# Patient Record
Sex: Female | Born: 1965 | Race: Black or African American | Hispanic: No | Marital: Married | Smoking: Never smoker
Health system: Southern US, Community
[De-identification: ages and names within clinical notes are randomized; demographics above are authoritative.]

## PROBLEM LIST (undated history)

## (undated) DIAGNOSIS — Z8742 Personal history of other diseases of the female genital tract: Secondary | ICD-10-CM

## (undated) DIAGNOSIS — Z800 Family history of malignant neoplasm of digestive organs: Secondary | ICD-10-CM

## (undated) DIAGNOSIS — Z9289 Personal history of other medical treatment: Secondary | ICD-10-CM

## (undated) DIAGNOSIS — E785 Hyperlipidemia, unspecified: Secondary | ICD-10-CM

## (undated) DIAGNOSIS — F419 Anxiety disorder, unspecified: Secondary | ICD-10-CM

## (undated) DIAGNOSIS — K219 Gastro-esophageal reflux disease without esophagitis: Secondary | ICD-10-CM

## (undated) DIAGNOSIS — Z8041 Family history of malignant neoplasm of ovary: Secondary | ICD-10-CM

## (undated) HISTORY — DX: Family history of malignant neoplasm of digestive organs: Z80.0

## (undated) HISTORY — DX: Personal history of other medical treatment: Z92.89

## (undated) HISTORY — DX: Gastro-esophageal reflux disease without esophagitis: K21.9

## (undated) HISTORY — PX: TUBAL LIGATION: SHX77

## (undated) HISTORY — DX: Anxiety disorder, unspecified: F41.9

## (undated) HISTORY — DX: Hyperlipidemia, unspecified: E78.5

## (undated) HISTORY — PX: DILATION AND CURETTAGE OF UTERUS: SHX78

## (undated) HISTORY — DX: Family history of malignant neoplasm of ovary: Z80.41

## (undated) HISTORY — DX: Personal history of other diseases of the female genital tract: Z87.42

---

## 2003-11-29 HISTORY — PX: LEEP: SHX91

## 2008-08-28 ENCOUNTER — Ambulatory Visit: Payer: Self-pay

## 2008-11-28 HISTORY — PX: COLONOSCOPY: SHX174

## 2009-09-01 ENCOUNTER — Ambulatory Visit: Payer: Self-pay

## 2009-11-24 ENCOUNTER — Ambulatory Visit: Payer: Self-pay | Admitting: Gastroenterology

## 2010-09-02 ENCOUNTER — Ambulatory Visit: Payer: Self-pay

## 2010-09-03 ENCOUNTER — Ambulatory Visit: Payer: Self-pay

## 2011-09-09 ENCOUNTER — Ambulatory Visit: Payer: Self-pay

## 2012-09-11 ENCOUNTER — Ambulatory Visit: Payer: Self-pay

## 2013-09-18 ENCOUNTER — Ambulatory Visit: Payer: Self-pay

## 2015-07-07 ENCOUNTER — Ambulatory Visit: Payer: Self-pay | Admitting: Family Medicine

## 2015-08-06 ENCOUNTER — Ambulatory Visit: Payer: Self-pay | Admitting: Family Medicine

## 2015-10-20 ENCOUNTER — Encounter: Payer: Self-pay | Admitting: Family Medicine

## 2015-11-23 ENCOUNTER — Other Ambulatory Visit: Payer: Self-pay | Admitting: Family Medicine

## 2015-12-08 ENCOUNTER — Ambulatory Visit: Payer: Self-pay | Admitting: Family Medicine

## 2015-12-14 ENCOUNTER — Encounter: Payer: Self-pay | Admitting: Family Medicine

## 2015-12-14 ENCOUNTER — Ambulatory Visit (INDEPENDENT_AMBULATORY_CARE_PROVIDER_SITE_OTHER): Payer: Commercial Managed Care - PPO | Admitting: Family Medicine

## 2015-12-14 VITALS — BP 140/88 | HR 91 | Temp 98.8°F | Resp 16 | Ht 65.0 in | Wt 182.2 lb

## 2015-12-14 DIAGNOSIS — J3089 Other allergic rhinitis: Secondary | ICD-10-CM

## 2015-12-14 DIAGNOSIS — J0100 Acute maxillary sinusitis, unspecified: Secondary | ICD-10-CM

## 2015-12-14 DIAGNOSIS — E785 Hyperlipidemia, unspecified: Secondary | ICD-10-CM

## 2015-12-14 DIAGNOSIS — K219 Gastro-esophageal reflux disease without esophagitis: Secondary | ICD-10-CM | POA: Diagnosis not present

## 2015-12-14 MED ORDER — LOVASTATIN 40 MG PO TABS: 40.0000 mg | ORAL_TABLET | Freq: Every day | ORAL | Status: DC

## 2015-12-14 MED ORDER — AMOXICILLIN-POT CLAVULANATE 875-125 MG PO TABS
1.0000 | ORAL_TABLET | Freq: Two times a day (BID) | ORAL | Status: DC
Start: 2015-12-14 — End: 2017-09-01

## 2015-12-14 MED ORDER — PREDNISONE 20 MG PO TABS: 20.0000 mg | ORAL_TABLET | Freq: Two times a day (BID) | ORAL | Status: DC

## 2015-12-14 MED ORDER — DEXTROMETHORPHAN-GUAIFENESIN 5-100 MG/5ML PO LIQD: 2.0000 [oz_av] | Freq: Two times a day (BID) | ORAL | Status: DC

## 2015-12-14 NOTE — Progress Notes (Signed)
Name: Cassidy Collins   MRN: [id number]    DOB: [birth year]   Date:12/14/2015       Progress Note  Subjective  Chief Complaint  Chief Complaint  Patient presents with  . Hyperlipidemia    6 month follow up  . Sinusitis    HPI  Hyperlipidemia  Patient has a history of hyperlipidemia for over 5 years.  Current medical regimen consist of lovastatin 40 mg daily at bedtime .  Compliance is good .  Diet and exercise are currently followed fairly well .  Risk factors for cardiovascular disease include hyperlipidemia relatively sedentary to lifestyle .   There have been no side effects from the medication.    GERD  Several year history gastroesophageal reflux symptoms. They are mild to moderate in severity complete consist of heartburn and burping gas. They respond well to omeprazole 40 mg daily. This been no melena or hematochezia and no hematemesis at any time.  Allergic rhinitis  Patient is currently using fluticasone as needed for her allergic rhinitis. She's had it for more than 5 years. Symptoms are mild to moderate and are worse during URIs or certain seasons of the year.  Sinusitis  Patient presents with greater than 7 day history of nasal congestion and drainage which is purulent in color. There is tenderness over the sinuses. There has been fever to - along with some associated chills on occasion. Usage of over-the-counter medications is not been affected. There is also accompanying cough productive of purulent sputum.  Past Medical History  Diagnosis Date  . Hyperlipidemia   . GERD (gastroesophageal reflux disease)     Social History  Substance Use Topics  . Smoking status: Never Smoker   . Smokeless tobacco: Not on file  . Alcohol Use: No     Current outpatient prescriptions:  .  lovastatin (MEVACOR) 40 MG tablet, , Disp: , Rfl: 0 .  omeprazole (PRILOSEC) 40 MG capsule, take 1 capsule by mouth once daily, Disp: 90 capsule, Rfl: 1  Allergies  Allergen  Reactions  . Bee Venom     Review of Systems  Constitutional: Negative for fever, chills and weight loss.  HENT: Positive for congestion. Negative for hearing loss, sore throat and tinnitus.   Eyes: Negative for blurred vision, double vision and redness.  Respiratory: Positive for cough. Negative for hemoptysis and shortness of breath.   Cardiovascular: Negative for chest pain, palpitations, orthopnea, claudication and leg swelling.  Gastrointestinal: Positive for heartburn. Negative for nausea, vomiting, diarrhea, constipation and blood in stool.  Genitourinary: Negative for dysuria, urgency, frequency and hematuria.  Musculoskeletal: Negative for myalgias, back pain, joint pain, falls and neck pain.  Skin: Negative for itching.  Neurological: Negative for dizziness, tingling, tremors, focal weakness, seizures, loss of consciousness, weakness and headaches.  Endo/Heme/Allergies: Does not bruise/bleed easily.  Psychiatric/Behavioral: Negative for depression and substance abuse. The patient is not nervous/anxious and does not have insomnia.      Objective  Filed Vitals:   12/14/15 1014  BP: 140/88  Pulse: 91  Temp: 98.8 F (37.1 C)  TempSrc: Oral  Resp: 16  Height: 5\' 5"  (1.651 m)  Weight: 182 lb 3.2 oz (82.645 kg)  SpO2: 94%     Physical Exam  Constitutional: She is oriented to person, place, and time and well-developed, well-nourished, and in no distress.  HENT:  Head: Normocephalic.  Tender over the frontal and most maxillary sinuses with swollen nasal turbinates with mostly clear discharge.  Eyes: EOM are normal.  Pupils are equal, round, and reactive to light.  Neck: Normal range of motion. No thyromegaly present.  Cardiovascular: Normal rate, regular rhythm and normal heart sounds.   No murmur heard. Pulmonary/Chest: Effort normal and breath sounds normal.  Abdominal: Soft. Bowel sounds are normal.  Musculoskeletal: Normal range of motion. She exhibits no edema.   Neurological: She is alert and oriented to person, place, and time. No cranial nerve deficit. Gait normal.  Skin: Skin is warm and dry. No rash noted.  Psychiatric: Memory and affect normal.      Assessment & Plan  1. Hyperlipidemia  - Comprehensive Metabolic Panel (CMET) - Lipid panel - Comprehensive Metabolic Panel (CMET) - Lipid panel - TSH - lovastatin (MEVACOR) 40 MG tablet; Take 1 tablet (40 mg total) by mouth at bedtime.  Dispense: 90 tablet; Refill: 1  2. Gastroesophageal reflux disease, esophagitis presence not specified Stable   3. Acute maxillary sinusitis, recurrence not specified As below - amoxicillin-clavulanate (AUGMENTIN) 875-125 MG tablet; Take 1 tablet by mouth 2 (two) times daily.  Dispense: 20 tablet; Refill: 0 - predniSONE (DELTASONE) 20 MG tablet; Take 1 tablet (20 mg total) by mouth 2 (two) times daily with a meal.  Dispense: 10 tablet; Refill: 0 - Dextromethorphan-Guaifenesin (DELSYM CGH/CHEST CONG DM CHILD) 5-100 MG/5ML LIQD; Take 2 oz by mouth 2 (two) times daily.  Dispense: 1 Bottle; Refill: 0  4. Other allergic rhinitis Flonase  - Dextromethorphan-Guaifenesin (DELSYM CGH/CHEST CONG DM CHILD) 5-100 MG/5ML LIQD; Take 2 oz by mouth 2 (two) times daily.  Dispense: 1 Bottle; Refill: 0

## 2016-06-20 ENCOUNTER — Ambulatory Visit: Payer: Commercial Managed Care - PPO | Admitting: Family Medicine

## 2016-09-22 ENCOUNTER — Other Ambulatory Visit: Payer: Self-pay | Admitting: Certified Nurse Midwife

## 2016-09-22 DIAGNOSIS — Z1231 Encounter for screening mammogram for malignant neoplasm of breast: Secondary | ICD-10-CM

## 2016-10-04 ENCOUNTER — Ambulatory Visit
Admission: RE | Admit: 2016-10-04 | Discharge: 2016-10-04 | Disposition: A | Discharge status: Home / Self Care | Payer: Commercial Managed Care - PPO | Source: Ambulatory Visit | Attending: Certified Nurse Midwife | Admitting: Certified Nurse Midwife

## 2016-10-04 DIAGNOSIS — Z9289 Personal history of other medical treatment: Secondary | ICD-10-CM

## 2016-10-04 DIAGNOSIS — Z1231 Encounter for screening mammogram for malignant neoplasm of breast: Secondary | ICD-10-CM | POA: Insufficient documentation

## 2016-10-04 HISTORY — DX: Personal history of other medical treatment: Z92.89

## 2017-09-01 ENCOUNTER — Ambulatory Visit (INDEPENDENT_AMBULATORY_CARE_PROVIDER_SITE_OTHER): Payer: Commercial Managed Care - PPO | Admitting: Family Medicine

## 2017-09-01 ENCOUNTER — Encounter: Payer: Self-pay | Admitting: Family Medicine

## 2017-09-01 VITALS — BP 124/86 | HR 82 | Temp 98.1°F | Resp 16 | Ht 65.0 in | Wt 176.8 lb

## 2017-09-01 DIAGNOSIS — E663 Overweight: Secondary | ICD-10-CM | POA: Diagnosis not present

## 2017-09-01 DIAGNOSIS — E785 Hyperlipidemia, unspecified: Secondary | ICD-10-CM | POA: Insufficient documentation

## 2017-09-01 DIAGNOSIS — Z0189 Encounter for other specified special examinations: Secondary | ICD-10-CM | POA: Diagnosis not present

## 2017-09-01 DIAGNOSIS — K219 Gastro-esophageal reflux disease without esophagitis: Secondary | ICD-10-CM

## 2017-09-01 DIAGNOSIS — Z23 Encounter for immunization: Secondary | ICD-10-CM | POA: Diagnosis not present

## 2017-09-01 DIAGNOSIS — J3089 Other allergic rhinitis: Secondary | ICD-10-CM | POA: Diagnosis not present

## 2017-09-01 DIAGNOSIS — J302 Other seasonal allergic rhinitis: Secondary | ICD-10-CM

## 2017-09-01 DIAGNOSIS — Z008 Encounter for other general examination: Secondary | ICD-10-CM

## 2017-09-01 MED ORDER — AZELASTINE-FLUTICASONE 137-50 MCG/ACT NA SUSP: 2.0000 | Freq: Every day | NASAL | 3 refills | Status: DC

## 2017-09-01 MED ORDER — LOVASTATIN 40 MG PO TABS: 40.0000 mg | ORAL_TABLET | Freq: Every day | ORAL | 1 refills | Status: DC

## 2017-09-01 MED ORDER — RANITIDINE HCL 150 MG PO TABS: 150.0000 mg | ORAL_TABLET | Freq: Every day | ORAL | 1 refills | Status: DC

## 2017-09-01 MED ORDER — FEXOFENADINE HCL 180 MG PO TABS: 180.0000 mg | ORAL_TABLET | Freq: Every day | ORAL | 1 refills | Status: DC

## 2017-09-01 NOTE — Progress Notes (Signed)
Name: Cassidy Collins   MRN: [id number]    DOB: 01/12/1966   Date:09/01/2017       Progress Note  Subjective  Chief Complaint  Chief Complaint  Patient presents with  . Hyperlipidemia    medication refill  . Annual Exam  . Sinusitis    HPI  Biometric Screening: Patient needs biometric screening for work/insurance.  She needs lipid panel, and fasting BG as well as waist circumference and BP recorded.  Also needs flu shot.  Does well woman exams with [facility] - Has appt to have this done on 09/25/2017.   Overweight: Trying to eat healthier; has a treadmill at home and plans to use this more often; last week she walked every day, but depending on the stress level of her job that week she may or may not exercise.  Denies polyphagia, polydipsia, or polyuria.  Hyperlipidemia: We will check labs today.  Taking Lovastatin and doing well on medication. Denies muscle aches, chest pain, or shortness of breath  Allergic Rhinitis: Has been using OTC flonase daily and claritin daily, would like to switch to allegra.  Symptoms are always worse in the spring and fall. She is concerned for sinusitis today because she has had sinus congestion since 08/19/2017.  Also having sinus pain and sinus headache, along with bilateral ear pressure. Coughs occasionally at night; no sneezing. No fevers/chills, vision changes.  GERD: PT was taking omeprazole daily, but has been out for over a year - she has been buying OTC. We discussed risks of long-term use of PPI's and we will try Zantac instead. Denies blood in stools/dark and tarry stools, vomiting/nausea.  She loves tomatoes and does drink a cocktail on the weekends which triggers her GERD - stress is also a trigger.  There are no active problems to display for this patient.   Past Surgical History:  Procedure Laterality Date  . COLON SURGERY      Family History  Problem Relation Age of Onset  . Colon cancer Father   . Breast cancer Maternal  Aunt 11    Social History   Social History  . Marital status: Married    Spouse name: N/A  . Number of children: N/A  . Years of education: N/A   Occupational History  . Not on file.   Social History Main Topics  . Smoking status: Never Smoker  . Smokeless tobacco: Never Used  . Alcohol use No  . Drug use: No  . Sexual activity: Yes    Partners: Male   Other Topics Concern  . Not on file   Social History Narrative  . No narrative on file     Current Outpatient Prescriptions:  .  lovastatin (MEVACOR) 40 MG tablet, Take 1 tablet (40 mg total) by mouth at bedtime., Disp: 90 tablet, Rfl: 1 .  omeprazole (PRILOSEC) 40 MG capsule, take 1 capsule by mouth once daily, Disp: 90 capsule, Rfl: 1 .  amoxicillin-clavulanate (AUGMENTIN) 875-125 MG tablet, Take 1 tablet by mouth 2 (two) times daily. (Patient not taking: Reported on 09/01/2017), Disp: 20 tablet, Rfl: 0 .  Dextromethorphan-Guaifenesin (DELSYM CGH/CHEST CONG DM CHILD) 5-100 MG/5ML LIQD, Take 2 oz by mouth 2 (two) times daily. (Patient not taking: Reported on 09/01/2017), Disp: 1 Bottle, Rfl: 0 .  predniSONE (DELTASONE) 20 MG tablet, Take 1 tablet (20 mg total) by mouth 2 (two) times daily with a meal. (Patient not taking: Reported on 09/01/2017), Disp: 10 tablet, Rfl: 0  Allergies  Allergen  Reactions  . Bee Venom      ROS  Constitutional: Negative for fever or weight change.  HEENT: See HPI Respiratory: Negative for cough and shortness of breath.   Cardiovascular: Negative for chest pain or palpitations.  Gastrointestinal: Negative for abdominal pain, no bowel changes.  Musculoskeletal: Negative for gait problem or joint swelling.  Skin: Negative for rash.  Neurological: Negative for dizziness or headache.  No other specific complaints in a complete review of systems (except as listed in HPI above).  Objective  Vitals:   09/01/17 1009  BP: 124/86  Pulse: 82  Resp: 16  Temp: 98.1 F (36.7 C)  TempSrc: Oral   SpO2: 97%  Weight: 176 lb 12.8 oz (80.2 kg)  Height:  (1.651 m)   Body mass index is 29.42 kg/m.  Physical Exam Constitutional: Patient appears well-developed and well-nourished. Obese. No distress.  HEENT: head atraumatic, normocephalic, pupils equal and reactive to light, ears, neck supple, throat within normal limits, sinuses are non-tender; turbinates are hypertrophied bilaterally. Cardiovascular: Normal rate, regular rhythm and normal heart sounds.  No murmur heard. No BLE edema. Pulmonary/Chest: Effort normal and breath sounds normal. No respiratory distress. Abdominal: Soft.  There is no tenderness. Psychiatric: Patient has a normal mood and affect. behavior is normal. Judgment and thought content normal.  No results found for this or any previous visit (from the past 2160 hour(s)).  PHQ2/9: Depression screen [facility] 2/9 09/01/2017 12/14/2015  Decreased Interest 0 0  Down, Depressed, Hopeless 0 0  PHQ - 2 Score 0 0   Fall Risk: Fall Risk  09/01/2017 12/14/2015  Falls in the past year? No No   Assessment & Plan  1. Encounter for biometric screening - Comprehensive metabolic panel - Lipid panel - Goal is to exercise more often and regularly - 3-4 days of walking per week.  2. Seasonal allergic rhinitis, unspecified trigger - fexofenadine (ALLEGRA) 180 MG tablet; Take 1 tablet (180 mg total) by mouth daily.  Dispense: 90 tablet; Refill: 1 - Azelastine-Fluticasone 137-50 MCG/ACT SUSP; Place 2 sprays into the nose daily.  Dispense: 23 g; Refill: 3  3. Hyperlipidemia, unspecified hyperlipidemia type - Lipid panel  4. Overweight (BMI 25.0-29.9) - Comprehensive metabolic panel - Lipid panel  5. Needs flu shot - Flu Vaccine QUAD 6+ mos PF IM (Fluarix Quad PF)  7. Gastroesophageal reflux disease without esophagitis - ranitidine (ZANTAC) 150 MG tablet; Take 1 tablet (150 mg total) by mouth at bedtime.  Dispense: 90 tablet; Refill: 1

## 2017-09-01 NOTE — Patient Instructions (Addendum)
Food Choices for Gastroesophageal Reflux Disease, Adult When you have gastroesophageal reflux disease (GERD), the foods you eat and your eating habits are very important. Choosing the right foods can help ease your discomfort. What guidelines do I need to follow?  Choose fruits, vegetables, whole grains, and low-fat dairy products.  Choose low-fat meat, fish, and poultry.  Limit fats such as oils, salad dressings, butter, nuts, and avocado.  Keep a food diary. This helps you identify foods that cause symptoms.  Avoid foods that cause symptoms. These may be different for everyone.  Eat small meals often instead of 3 large meals a day.  Eat your meals slowly, in a place where you are relaxed.  Limit fried foods.  Cook foods using methods other than frying.  Avoid drinking alcohol.  Avoid drinking large amounts of liquids with your meals.  Avoid bending over or lying down until 2-3 hours after eating. What foods are not recommended? These are some foods and drinks that may make your symptoms worse: Vegetables  Tomatoes. Tomato juice. Tomato and spaghetti sauce. Chili peppers. Onion and garlic. Horseradish. Fruits  Oranges, grapefruit, and lemon (fruit and juice). Meats  High-fat meats, fish, and poultry. This includes hot dogs, ribs, ham, sausage, salami, and bacon. Dairy  Whole milk and chocolate milk. Sour cream. Cream. Butter. Ice cream. Cream cheese. Drinks  Coffee and tea. Bubbly (carbonated) drinks or energy drinks. Condiments  Hot sauce. Barbecue sauce. Sweets/Desserts  Chocolate and cocoa. Donuts. Peppermint and spearmint. Fats and Oils  High-fat foods. This includes French fries and potato chips. Other  Vinegar. Strong spices. This includes black pepper, white pepper, red pepper, cayenne, curry powder, cloves, ginger, and chili powder. The items listed above may not be a complete list of foods and drinks to avoid. Contact your dietitian for more information.    This information is not intended to replace advice given to you by your health care provider. Make sure you discuss any questions you have with your health care provider. Document Released: 05/15/2012 Document Revised: 04/21/2016 Document Reviewed: 09/18/2013 Elsevier Interactive Patient Education  2017 Elsevier Inc.  

## 2017-09-02 LAB — LIPID PANEL
CHOL/HDL RATIO: 3.2 ratio (ref 0.0–4.4)
Cholesterol, Total: 247 mg/dL — ABNORMAL HIGH (ref 100–199)
HDL: 78 mg/dL (ref >39)
LDL Calculated: 151 mg/dL — ABNORMAL HIGH (ref 0–99)
TRIGLYCERIDES: 88 mg/dL (ref 0–149)
VLDL Cholesterol Cal: 18 mg/dL (ref 5–40)

## 2017-09-02 LAB — COMPREHENSIVE METABOLIC PANEL
A/G RATIO: 1.7 (ref 1.2–2.2)
ALBUMIN: 4.6 g/dL (ref 3.5–5.5)
ALK PHOS: 113 IU/L (ref 39–117)
ALT: 19 IU/L (ref 0–32)
AST: 18 IU/L (ref 0–40)
BILIRUBIN TOTAL: 0.3 mg/dL (ref 0.0–1.2)
BUN / CREAT RATIO: 10 (ref 9–23)
BUN: 10 mg/dL (ref 6–24)
CO2: 24 mmol/L (ref 20–29)
CREATININE: 0.96 mg/dL (ref 0.57–1.00)
Calcium: 9.6 mg/dL (ref 8.7–10.2)
Chloride: 104 mmol/L (ref 96–106)
GFR calc Af Amer: 80 mL/min/{1.73_m2} (ref >59)
GFR calc non Af Amer: 69 mL/min/{1.73_m2} (ref >59)
GLOBULIN, TOTAL: 2.7 g/dL (ref 1.5–4.5)
Glucose: 89 mg/dL (ref 65–99)
Potassium: 4.7 mmol/L (ref 3.5–5.2)
Sodium: 143 mmol/L (ref 134–144)
Total Protein: 7.3 g/dL (ref 6.0–8.5)

## 2017-09-25 ENCOUNTER — Encounter: Payer: Self-pay | Admitting: Certified Nurse Midwife

## 2017-09-25 ENCOUNTER — Ambulatory Visit (INDEPENDENT_AMBULATORY_CARE_PROVIDER_SITE_OTHER): Payer: Commercial Managed Care - PPO | Admitting: Certified Nurse Midwife

## 2017-09-25 VITALS — BP 132/90 | HR 88 | Ht 65.0 in | Wt 176.0 lb

## 2017-09-25 DIAGNOSIS — Z124 Encounter for screening for malignant neoplasm of cervix: Secondary | ICD-10-CM

## 2017-09-25 DIAGNOSIS — Z8041 Family history of malignant neoplasm of ovary: Secondary | ICD-10-CM

## 2017-09-25 DIAGNOSIS — Z01419 Encounter for gynecological examination (general) (routine) without abnormal findings: Secondary | ICD-10-CM

## 2017-09-25 DIAGNOSIS — Z1231 Encounter for screening mammogram for malignant neoplasm of breast: Secondary | ICD-10-CM

## 2017-09-25 DIAGNOSIS — Z1211 Encounter for screening for malignant neoplasm of colon: Secondary | ICD-10-CM | POA: Diagnosis not present

## 2017-09-25 DIAGNOSIS — Z1239 Encounter for other screening for malignant neoplasm of breast: Secondary | ICD-10-CM

## 2017-09-25 DIAGNOSIS — Z800 Family history of malignant neoplasm of digestive organs: Secondary | ICD-10-CM | POA: Diagnosis not present

## 2017-09-25 NOTE — Progress Notes (Addendum)
Gynecology Annual Exam  PCP: Hubbard Hartshorn, FNP  Chief Complaint:  Chief Complaint  Patient presents with  . Gynecologic Exam    History of Present Illness:Cassidy Collins is a 51 year old African American/Black female , G 1 P 1 0 0 1 , who presents for her annual exam . She is having no significant GYN problems.  Her menses are absent and she is postmenopausal since 2009 (age 37).  She has had no spotting.   The patient's past medical history is notable for a history of hyperlipdemia, GERD, allergic rhinitis.  Since her last annual GYN exam dated 09/22/2016 , she has had no significant changes in her health history.  Had her flu shot this year.  She is sexually active.  Her most recent pap smear was obtained 09/22/2016 and was NIL/neg HRHPV. 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 Her most recent mammogram obtained on 10/04/2016 was normal.  There is a positive history of breast cancer in her maternal aunt. Genetic testing has not been done.  There is a family history of ovarian cancer in her maternal grandmother. Genetic testing has been done. Her sister tested negative for BRCA 1 and negative for BRCA 2.  The patient does do monthly self breast exams.  Last colonoscopy :2010 (father has colon cancer). Results: normal. Next due now. The patient does not smoke.  The patient does drink occasionally.  The patient does not use illegal drugs.  The patient usually exercises regularly on the elliptical and treadmill.  The patient does get adequate calcium in her diet  And with her vitamins She had a recent cholesterol screen in 2018 by PCP that was abnormal.    The patient denies current symptoms of depression.    Review of Systems: Review of Systems  Constitutional: Negative for chills, fever and weight loss.  HENT: Negative for congestion, sinus pain and sore throat.   Eyes: Negative for blurred vision and pain.  Respiratory: Negative for  hemoptysis, shortness of breath and wheezing.   Cardiovascular: Negative for chest pain, palpitations and leg swelling.  Gastrointestinal: Negative for abdominal pain, blood in stool, diarrhea, heartburn, nausea and vomiting.  Genitourinary: Negative for dysuria, frequency, hematuria and urgency.  Musculoskeletal: Negative for back pain, joint pain and myalgias.  Skin: Negative for itching and rash.  Neurological: Negative for dizziness, tingling and headaches.  Endo/Heme/Allergies: Negative for environmental allergies and polydipsia. Does not bruise/bleed easily.       Negative for hirsutism   Psychiatric/Behavioral: Negative for depression. The patient is not nervous/anxious and does not have insomnia.     Past Medical History:  Past Medical History:  Diagnosis Date  . Family history of colon cancer   . Family history of ovarian cancer   . GERD (gastroesophageal reflux disease)   . History of abnormal cervical Pap smear    positive HRHPV  . Hyperlipidemia     Past Surgical History:  Past Surgical History:  Procedure Laterality Date  . COLONOSCOPY  2010   Dr Adair Laundry  . DILATION AND CURETTAGE OF UTERUS    . 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 TUBAL LIGATION      Family History:  Family History  Problem Relation Age of Onset  . Colon cancer Father 49  . Hypertension Father   . Breast cancer Maternal Aunt 69  . Ovarian cancer Maternal Grandmother 75  . Hypertension Mother  Social History:  Social History   Social History  . Marital status: Married    Spouse name: N/A  . Number of children: 1  . Years of education: N/A   Occupational History  . Claims Coder Specialist    Social History Main Topics  . Smoking status: Never Smoker  . Smokeless tobacco: Never Used  . Alcohol use 0.0 oz/week     Comment: occasionally on weekend  . Drug use: No  . Sexual activity: Yes    Partners: Male    Birth control/ protection: Post-menopausal   Other  Topics Concern  . Not on file   Social History Narrative  . No narrative on file    Allergies:  Allergies  Allergen Reactions  . Bee Venom     Medications: Prior to Admission medications   Medication Sig Start Date End Date Taking? Authorizing Provider  Azelastine-Fluticasone 137-50 MCG/ACT SUSP Place 2 sprays into the nose daily. 09/01/17   Hubbard Hartshorn, FNP  fexofenadine (ALLEGRA) 180 MG tablet Take 1 tablet (180 mg total) by mouth daily. 09/01/17   Hubbard Hartshorn, FNP  lovastatin (MEVACOR) 40 MG tablet Take 1 tablet (40 mg total) by mouth at bedtime. 09/01/17   Hubbard Hartshorn, FNP  ranitidine (ZANTAC) 150 MG tablet Take 1 tablet (150 mg total) by mouth at bedtime. 09/01/17   Hubbard Hartshorn, FNP  And [employer] multivitamins  Physical Exam Vitals: BP 132/90   Pulse 88   Ht 5' 5"  (1.651 m)   Wt 176 lb (79.8 kg)   BMI 29.29 kg/m   General: BF in NAD, appears younger than stated age 7: normocephalic, anicteric Neck: no thyroid enlargement, no palpable nodules, no cervical lymphadenopathy  Pulmonary: No increased work of breathing, CTAB Cardiovascular: RRR, without murmur  Breast: Breast symmetrical, no tenderness, no palpable nodules or masses, no skin or nipple retraction present, no nipple discharge.  No axillary, infraclavicular or supraclavicular lymphadenopathy. Abdomen: Soft, non-tender, non-distended.  Umbilicus without lesions.  No hepatomegaly or masses palpable. No evidence of hernia. Genitourinary:  External: Normal external female genitalia.  Normal urethral meatus, normal Bartholin's and Skene's glands.    Vagina: Normal vaginal mucosa, no evidence of prolapse.    Cervix: Grossly normal in appearance, no bleeding, non-tender  Uterus: Anteflexed, normal size, shape, and consistency, mobile, and non-tender  Adnexa: No adnexal masses, non-tender  Rectal: deferred  Lymphatic: no evidence of inguinal lymphadenopathy Extremities: no edema, erythema, or  tenderness Neurologic: Grossly intact Psychiatric: mood appropriate, affect full     Assessment: 51 y.o. annual gyn exam Family history of ovarian, breast and colon cancer  Plan:   1) Breast cancer screening - recommend monthly self breast exam and annual screening mammogram. Mammogram was ordered today. Recommended MYRISK testing. Given booklet on MYRISK testing. She wants to check coverage with her insurance and will let me know if she desires 2) Colon cancer screening: will refer to [facility] for colonoscopy  3) Cervical cancer screening - Pap was done.   4) Osteoporosis prevention: Discussed calcium and vitamin D3 requirements. Encouraged weight bearing exercise.  5) Routine healthcare maintenance including cholesterol and diabetes screening managed by PCP   6) RTO in 1 year.  Dalia Heading, CNM

## 2017-09-25 NOTE — Patient Instructions (Signed)
Preventing Osteoporosis, Adult Osteoporosis is a condition that causes the bones to get weaker. With osteoporosis, the bones become thinner, and the normal spaces in bone tissue become larger. This can make the bones weak and cause them to break more easily. People who have osteoporosis are more likely to break their wrist, spine, or hip. Even a minor accident or injury can be enough to break weak bones. Osteoporosis can occur with aging. Your body constantly replaces old bone tissue with new tissue. As you get older, you may lose bone tissue more quickly, or it may be replaced more slowly. Osteoporosis is more likely to develop if you have poor nutrition or do not get enough calcium or vitamin D. Other lifestyle factors can also play a role. By making some diet and lifestyle changes, you can help to keep your bones healthy and help to prevent osteoporosis. What nutrition changes can be made? Nutrition plays an important role in maintaining healthy, strong bones.  Make sure you get enough calcium every day from food or from calcium supplements. ? If you are age 50 or younger, aim to get 1,000 mg of calcium every day. ? If you are older than age 50, aim to get 1,200 mg of calcium every day.  Try to get enough vitamin D every day. ? If you are age 70 or younger, aim to get 600 international units (IU) every day. ? If you are older than age 70, aim to get 800 international units every day.  Follow a healthy diet. Eat plenty of foods that contain calcium and vitamin D. ? Calcium is in milk, cheese, yogurt, and other dairy products. Some fish and vegetables are also good sources of calcium. Many foods such as cereals and breads have had calcium added to them (are fortified). Check nutrition labels to see how much calcium is in a food or drink. ? Foods that contain vitamin D include milk, cereals, salmon, and tuna. Your body also makes vitamin D when you are out in the sun. Bare skin exposure to the sun on  your face, arms, legs, or back for no more than 30 minutes a day, 2 times per week is more than enough. Beyond that, it is important to use sunblock to protect your skin from sunburn, which increases your risk for skin cancer.  What lifestyle changes can be made? Making changes in your everyday life can also play an important role in preventing osteoporosis.  Stay active and get exercise every day. Ask your health care provider what types of exercise are best for you.  Do not use any products that contain nicotine or tobacco, such as cigarettes and e-cigarettes. If you need help quitting, ask your health care provider.  Limit alcohol intake to no more than 1 drink a day for nonpregnant women and 2 drinks a day for men. One drink equals 12 oz of beer, 5 oz of wine, or 1 oz of hard liquor.  Why are these changes important? Making these nutrition and lifestyle changes can:  Help you develop and maintain healthy, strong bones.  Prevent loss of bone mass and the problems that are caused by that loss, such as broken bones and delayed healing.  Make you feel better mentally and physically.  What can happen if changes are not made? Problems that can result from osteoporosis can be very serious. These may include:  A higher risk of broken bones that are painful and do not heal well.  Physical malformations, such as   a collapsed spine or a hunched back.  Problems with movement.  Where to find support: If you need help making changes to prevent osteoporosis, talk with your health care provider. You can ask for a referral to a diet and nutrition specialist (dietitian) and a physical therapist. Where to find more information: Learn more about osteoporosis from:  NIH Osteoporosis and Related Bone Diseases National Resource Center: www.niams.nih.gov/health_info/bone/osteoporosis/osteoporosis_ff.asp  U.S. Office on Women's Health:  www.womenshealth.gov/publications/our-publications/fact-sheet/osteoporosis.html  National Osteoporosis Foundation: www.nof.org/patients/what-is-osteoporosis/  Summary  Osteoporosis is a condition that causes weak bones that are more likely to break.  Eating a healthy diet and making sure you get enough calcium and vitamin D can help prevent osteoporosis.  Other ways to reduce your risk of osteoporosis include getting regular exercise and avoiding alcohol and products that contain nicotine or tobacco. This information is not intended to replace advice given to you by your health care provider. Make sure you discuss any questions you have with your health care provider. Document Released: 11/29/2015 Document Revised: 07/25/2016 Document Reviewed: 07/25/2016 Elsevier Interactive Patient Education  2018 Elsevier Inc.  

## 2017-09-26 ENCOUNTER — Encounter: Payer: Self-pay | Admitting: Certified Nurse Midwife

## 2017-09-27 LAB — IGP,RFX APTIMA HPV ALL PTH: PAP SMEAR COMMENT: 0

## 2017-10-16 ENCOUNTER — Ambulatory Visit
Admission: RE | Admit: 2017-10-16 | Discharge: 2017-10-16 | Disposition: A | Discharge status: Home / Self Care | Payer: Commercial Managed Care - PPO | Source: Ambulatory Visit | Attending: Certified Nurse Midwife | Admitting: Certified Nurse Midwife

## 2017-10-16 DIAGNOSIS — Z1231 Encounter for screening mammogram for malignant neoplasm of breast: Secondary | ICD-10-CM | POA: Diagnosis present

## 2017-10-16 DIAGNOSIS — Z1239 Encounter for other screening for malignant neoplasm of breast: Secondary | ICD-10-CM

## 2017-11-28 DIAGNOSIS — F419 Anxiety disorder, unspecified: Secondary | ICD-10-CM

## 2017-11-28 HISTORY — DX: Anxiety disorder, unspecified: F41.9

## 2018-01-20 ENCOUNTER — Other Ambulatory Visit: Payer: Self-pay

## 2018-01-20 ENCOUNTER — Encounter: Payer: Self-pay | Admitting: Gynecology

## 2018-01-20 ENCOUNTER — Ambulatory Visit
Admission: EM | Admit: 2018-01-20 | Discharge: 2018-01-20 | Disposition: A | Discharge status: Home / Self Care | Payer: Commercial Managed Care - PPO | Attending: Orthopedic Surgery | Admitting: Orthopedic Surgery

## 2018-01-20 DIAGNOSIS — J320 Chronic maxillary sinusitis: Secondary | ICD-10-CM

## 2018-01-20 DIAGNOSIS — J019 Acute sinusitis, unspecified: Secondary | ICD-10-CM

## 2018-01-20 DIAGNOSIS — J0100 Acute maxillary sinusitis, unspecified: Secondary | ICD-10-CM

## 2018-01-20 MED ORDER — AMOXICILLIN-POT CLAVULANATE 875-125 MG PO TABS: 1.0000 | ORAL_TABLET | Freq: Two times a day (BID) | ORAL | 0 refills | Status: AC

## 2018-01-20 NOTE — ED Provider Notes (Signed)
[facility]    CSN: [id number] Arrival date & time: 01/20/18  1007     History   Chief Complaint Chief Complaint  Patient presents with  . Sinusitis    HPI Cassidy Collins is a 52 y.o. female.   Presents urgent care facility for evaluation of sinus pain, pressure, congestion, runny nose.  Symptoms have been present for 6 days.  She is been taking over-the-counter medications consisting of DayQuil, NyQuil, Tylenol and ibuprofen with mild improvement a few days ago but her last 2 days her symptoms have increased.  She feels that she has had a lot of sinus pain and pressure.  Denies any fevers.  No body aches, nausea vomiting or diarrhea.  Cough is nonproductive.  No chest pain or shortness of breath.  HPI  Past Medical History:  Diagnosis Date  . Family history of colon cancer   . Family history of ovarian cancer   . GERD (gastroesophageal reflux disease)   . History of abnormal cervical Pap smear    positive HRHPV  . Hyperlipidemia     Patient Active Problem List   Diagnosis Date Noted  . Family history of colon cancer   . Family history of ovarian cancer   . Hyperlipidemia 09/01/2017  . Overweight (BMI 25.0-29.9) 09/01/2017  . Other allergic rhinitis 09/01/2017  . Gastroesophageal reflux disease without esophagitis 09/01/2017    Past Surgical History:  Procedure Laterality Date  . COLONOSCOPY  2010   Dr Vanetta Shawl  . DILATION AND CURETTAGE OF UTERUS    . 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 TUBAL LIGATION      OB History    Gravida Para Term Preterm AB Living   1 1 1     1    SAB TAB Ectopic Multiple Live Births           1       Home Medications    Prior to Admission medications   Medication Sig Start Date End Date Taking? Authorizing Provider  Azelastine-Fluticasone 137-50 MCG/ACT SUSP Place 2 sprays into the nose daily. 09/01/17  Yes Doren Custard, FNP  fexofenadine (ALLEGRA) 180 MG tablet Take 1 tablet (180 mg total) by  mouth daily. 09/01/17  Yes Doren Custard, FNP  lovastatin (MEVACOR) 40 MG tablet Take 1 tablet (40 mg total) by mouth at bedtime. 09/01/17  Yes Doren Custard, FNP  ranitidine (ZANTAC) 150 MG tablet Take 1 tablet (150 mg total) by mouth at bedtime. 09/01/17  Yes Doren Custard, FNP  amoxicillin-clavulanate (AUGMENTIN) 875-125 MG tablet Take 1 tablet by mouth every 12 (twelve) hours for 7 days. 7 days 01/20/18 01/27/18  Evon Slack, PA-C    Family History Family History  Problem Relation Age of Onset  . Colon cancer Father 87  . Hypertension Father   . Breast cancer Maternal Aunt 42  . Ovarian cancer Maternal Grandmother 75  . Hypertension Mother     Social History Social History   Tobacco Use  . Smoking status: Never Smoker  . Smokeless tobacco: Never Used  Substance Use Topics  . Alcohol use: Yes    Alcohol/week: 0.0 oz    Comment: occasionally on weekend  . Drug use: No     Allergies   Bee venom   Review of Systems Review of Systems  Constitutional: Negative for fever.  HENT: Positive for congestion, rhinorrhea, sinus pressure, sinus pain and sore throat. Negative for ear discharge, trouble  swallowing and voice change.   Respiratory: Positive for cough. Negative for shortness of breath, wheezing and stridor.   Cardiovascular: Negative for chest pain.  Gastrointestinal: Negative for abdominal pain, diarrhea, nausea and vomiting.  Genitourinary: Negative for dysuria, flank pain and pelvic pain.  Musculoskeletal: Positive for myalgias. Negative for back pain.  Skin: Negative for rash.  Neurological: Negative for dizziness and headaches.     Physical Exam Triage Vital Signs ED Triage Vitals  Enc Vitals Group     BP 01/20/18 1029 (!) 145/93     Pulse Rate 01/20/18 1029 91     Resp 01/20/18 1029 16     Temp 01/20/18 1029 98.5 F (36.9 C)     Temp Source 01/20/18 1029 Oral     SpO2 01/20/18 1029 99 %     Weight 01/20/18 1025 181 lb (82.1 kg)     Height 01/20/18  1025 5\' 5"  (1.651 m)     Head Circumference --      Peak Flow --      Pain Score 01/20/18 1025 6     Pain Loc --      Pain Edu? --      Excl. in GC? --    No data found.  Updated Vital Signs BP (!) 145/93 (BP Location: Left Arm)   Pulse 91   Temp 98.5 F (36.9 C) (Oral)   Resp 16   Ht 5\' 5"  (1.651 m)   Wt 181 lb (82.1 kg)   SpO2 99%   BMI 30.12 kg/m   Visual Acuity Right Eye Distance:   Left Eye Distance:   Bilateral Distance:    Right Eye Near:   Left Eye Near:    Bilateral Near:     Physical Exam  Constitutional: She is oriented to person, place, and time. She appears well-developed and well-nourished. No distress.  HENT:  Head: Normocephalic and atraumatic.  Right Ear: Hearing, tympanic membrane, external ear and ear canal normal.  Left Ear: Hearing, tympanic membrane, external ear and ear canal normal.  Nose: Rhinorrhea present.  Mouth/Throat: Mucous membranes are normal. No trismus in the jaw. No uvula swelling. Posterior oropharyngeal erythema present. No oropharyngeal exudate, posterior oropharyngeal edema or tonsillar abscesses. No tonsillar exudate.  Positive frontal maxillary sinus tenderness to palpation.  No pharyngeal erythema exudates.  Uvula is midline.  Eyes: Conjunctivae are normal. Right eye exhibits no discharge. Left eye exhibits no discharge.  Neck: Normal range of motion.  Cardiovascular: Normal rate and regular rhythm.  Pulmonary/Chest: Effort normal and breath sounds normal. No stridor. No respiratory distress. She has no wheezes. She has no rales.  Abdominal: Soft. She exhibits no distension. There is no tenderness.  Musculoskeletal: Normal range of motion. She exhibits no deformity.  Lymphadenopathy:    She has cervical adenopathy.  Neurological: She is alert and oriented to person, place, and time. She has normal reflexes.  Skin: Skin is warm and dry.  Psychiatric: She has a normal mood and affect. Her behavior is normal. Thought content  normal.     UC Treatments / Results  Labs (all labs ordered are listed, but only abnormal results are displayed) Labs Reviewed - No data to display  EKG  EKG Interpretation None       Radiology No results found.  Procedures Procedures (including critical care time)  Medications Ordered in UC Medications - No data to display   Initial Impression / Assessment and Plan / UC Course  I have reviewed the triage  vital signs and the nursing notes.  Pertinent labs & imaging results that were available during my care of the patient were reviewed by me and considered in my medical decision making (see chart for details).     52 year old female with acute sinusitis.  Symptoms been present for nearly 1 week with increasing symptoms over the last couple days.  She is started on Augmentin.  She will continue with over-the-counter symptomatic treatment.  She states given signs and symptoms to return to the clinic for.  Final Clinical Impressions(s) / UC Diagnoses   Final diagnoses:  Chronic maxillary sinusitis    ED Discharge Orders        Ordered    amoxicillin-clavulanate (AUGMENTIN) 875-125 MG tablet  Every 12 hours     01/20/18 1056        Evon Slack, [place] 01/20/18 1117

## 2018-01-20 NOTE — ED Triage Notes (Signed)
Per patient c/o sinus problem  X 7 days.

## 2018-01-20 NOTE — Discharge Instructions (Signed)
Take medications as prescribed.  Continue with over-the-counter medications.  Would recommend Flonase.

## 2018-01-24 ENCOUNTER — Telehealth: Payer: Self-pay | Admitting: Family Medicine

## 2018-01-24 NOTE — Telephone Encounter (Signed)
Patient must be seen here. Cannot call in medication unless our office has seen them

## 2018-01-24 NOTE — Telephone Encounter (Signed)
Called pt and she states she will call back to schedule when she can

## 2018-01-24 NOTE — Telephone Encounter (Signed)
Copied from CRM [id number]. Topic: Quick Communication - See Telephone Encounter >> Jan 24, 2018  9:06 AM Guinevere FerrariMorris, Shaira Sova E, NT wrote: CRM for notification. See Telephone encounter for: Patient called and said she was just seen at urgent care for a sinus infection. Patient said she didn't receive anything for her cough and wanted to see if the doctor could call her something else in. [facility] [address] [place] [phone] (Phone) [phone] (Fax)    01/24/18.

## 2018-03-02 ENCOUNTER — Ambulatory Visit: Payer: Commercial Managed Care - PPO | Admitting: Family Medicine

## 2018-03-02 ENCOUNTER — Encounter: Payer: Self-pay | Admitting: Certified Nurse Midwife

## 2018-03-02 HISTORY — PX: COLONOSCOPY W/ BIOPSIES: SHX1374

## 2018-03-09 ENCOUNTER — Ambulatory Visit: Payer: Self-pay | Admitting: Nurse Practitioner

## 2018-03-23 ENCOUNTER — Encounter: Payer: Self-pay | Admitting: Certified Nurse Midwife

## 2018-06-25 ENCOUNTER — Encounter: Payer: Self-pay | Admitting: Family Medicine

## 2018-06-25 ENCOUNTER — Ambulatory Visit (INDEPENDENT_AMBULATORY_CARE_PROVIDER_SITE_OTHER): Payer: Commercial Managed Care - PPO | Admitting: Family Medicine

## 2018-06-25 VITALS — BP 138/96 | HR 89 | Temp 98.7°F | Resp 16 | Ht 65.0 in | Wt 173.0 lb

## 2018-06-25 DIAGNOSIS — Z23 Encounter for immunization: Secondary | ICD-10-CM

## 2018-06-25 DIAGNOSIS — R000 Tachycardia, unspecified: Secondary | ICD-10-CM | POA: Diagnosis not present

## 2018-06-25 DIAGNOSIS — F321 Major depressive disorder, single episode, moderate: Secondary | ICD-10-CM

## 2018-06-25 DIAGNOSIS — K219 Gastro-esophageal reflux disease without esophagitis: Secondary | ICD-10-CM | POA: Diagnosis not present

## 2018-06-25 DIAGNOSIS — E785 Hyperlipidemia, unspecified: Secondary | ICD-10-CM

## 2018-06-25 DIAGNOSIS — F419 Anxiety disorder, unspecified: Secondary | ICD-10-CM

## 2018-06-25 DIAGNOSIS — E663 Overweight: Secondary | ICD-10-CM

## 2018-06-25 DIAGNOSIS — I10 Essential (primary) hypertension: Secondary | ICD-10-CM

## 2018-06-25 DIAGNOSIS — Z114 Encounter for screening for human immunodeficiency virus [HIV]: Secondary | ICD-10-CM

## 2018-06-25 MED ORDER — OMEPRAZOLE 40 MG PO CPDR: 40.0000 mg | DELAYED_RELEASE_CAPSULE | Freq: Every day | ORAL | 1 refills | Status: DC

## 2018-06-25 MED ORDER — ESCITALOPRAM OXALATE 10 MG PO TABS: 10.0000 mg | ORAL_TABLET | Freq: Every day | ORAL | 0 refills | Status: DC

## 2018-06-25 MED ORDER — LISINOPRIL 10 MG PO TABS: 10.0000 mg | ORAL_TABLET | Freq: Every day | ORAL | 0 refills | Status: DC

## 2018-06-25 MED ORDER — HYDROXYZINE HCL 10 MG PO TABS: 10.0000 mg | ORAL_TABLET | Freq: Three times a day (TID) | ORAL | 0 refills | Status: DC | PRN

## 2018-06-25 NOTE — Patient Instructions (Addendum)
Psychologytoday.com - go to therapist finder to find a counselor.  12 Ways to Curb Anxiety  ?Anxiety is normal human sensation. It is what helped our ancestors survive the pitfalls of the wilderness. Anxiety is defined as experiencing worry or nervousness about an imminent event or something with an uncertain outcome. It is a feeling experienced by most people at some point in their lives. Anxiety can be triggered by a very personal issue, such as the illness of a loved one, or an event of global proportions, such as a refugee crisis. Some of the symptoms of anxiety are:  Feeling restless.  Having a feeling of impending danger.  Increased heart rate.  Rapid breathing. Sweating.  Shaking.  Weakness or feeling tired.  Difficulty concentrating on anything except the current worry.  Insomnia.  Stomach or bowel problems. What can we do about anxiety we may be feeling? There are many techniques to help manage stress and relax. Here are 12 ways you can reduce your anxiety almost immediately: 1. Turn off the constant feed of information. Take a social media sabbatical. Studies have shown that social media directly contributes to social anxiety.  2. Monitor your television viewing habits. Are you watching shows that are also contributing to your anxiety, such as 24-hour news stations? Try watching something else, or better yet, nothing at all. Instead, listen to music, read an inspirational book or practice a hobby. 3. Eat nutritious meals. Also, don't skip meals and keep healthful snacks on hand. Hunger and poor diet contributes to feeling anxious. 4. Sleep. Sleeping on a regular schedule for at least seven to eight hours a night will do wonders for your outlook when you are awake. 5. Exercise. Regular exercise will help rid your body of that anxious energy and help you get more restful sleep. 6. Try deep (diaphragmatic) breathing. Inhale slowly through your nose for five seconds and exhale through your  mouth. 7. Practice acceptance and gratitude. When anxiety hits, accept that there are things out of your control that shouldn't be of immediate concern.  8. Seek out humor. When anxiety strikes, watch a funny video, read jokes or call a friend who makes you laugh. Laughter is healing for our bodies and releases endorphins that are calming. 9. Stay positive. Take the effort to replace negative thoughts with positive ones. Try to see a stressful situation in a positive light. Try to come up with solutions rather than dwelling on the problem. 10. Figure out what triggers your anxiety. Keep a journal and make note of anxious moments and the events surrounding them. This will help you identify triggers you can avoid or even eliminate. 11. Talk to someone. Let a trusted friend, family member or even trained professional know that you are feeling overwhelmed and anxious. Verbalize what you are feeling and why.  12. Volunteer. If your anxiety is triggered by a crisis on a large scale, become an advocate and work to resolve the problem that is causing you unease. Anxiety is often unwelcome and can become overwhelming. If not kept in check, it can become a disorder that could require medical treatment. However, if you take the time to care for yourself and avoid the triggers that make you anxious, you will be able to find moments of relaxation and clarity that make your life much more enjoyable.  To help with reflux: - Elevate your head of bed - either with a few extra pillows, a wedge pillow, or by placing two bed risers under the  head of bed posts. - Avoid the following foods: citrus, fatty foods, chocolate, peppermint, and excessive alcohol, along with sodas, orange juice (acidic drinks) - Stop eating at least 3 hours before going to bed, minimize naps/laying down after eating. - No smoking. - If you are overweight or obese, exercising and losing weight will also help your symptoms. - Caution: prolonged use  of proton pump inhibitors like omeprazole (Prilosec), pantoprazole (Protonix), esomeprazole (Nexium), and others like Dexilant and Aciphex may increase your risk of pneumonia, Clostridium difficile colitis, osteoporosis, anemia and other health complications

## 2018-06-25 NOTE — Progress Notes (Signed)
Name: Cassidy Collins   MRN: [id number]    DOB: 02/08/1966   Date:06/26/2018       Progress Note  Subjective  Chief Complaint  Chief Complaint  Patient presents with  . Gastroesophageal Reflux    zantac do not work  . Anxiety    nervous energy due to work, stress  . Hypertension    HPI  GERD: - Current medication regimen: Taking zantac - tried to do this instead of omeprazole but it has not been working; would like to go back to omeprazole -discussed risk of long term PPI use and pt verbalizes understanding. - Possible Triggers: no or minimal alcohol (she stopped drinking alcohol 2 weeks ago; was drinking on the weekends only), no or mild caffeine use, using NSAID's regularly (excedrine) alcohol, caffeine, lying down after eating and tomato sauce - Endorses: chest discomfort - like something was "stuck" in her esophagus. 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  Got a speeding ticket recenty, car battery died and she had some extra expenses because of this.  Her son is in college and this is also stressful for her. Current Medication Regimen: Not on any medications Current Symptoms Include: depressed mood, insomnia, psychomotor agitation, difficulty concentrating, anxiety, disturbed sleep, Pt Denies the following symptoms: insomnia, hypersomnia, impaired memory, recurrent thoughts of death, suicidal thoughts with specific plan, panic attacks, Seeing Psychiatry? No Attending Counseling? No  HTN:  -does not take medications - has not been treated previously.  She has been under increased stress lately and thinks this is why her levels are elevated.  - no TIAs, no  chest pain on exertion, no dyspnea on exertion, no swelling of ankles, no orthostatic dizziness or lightheadedness, no palpitations - DASH diet discussed - pt does not follow a low sodium diet - The following  are contributing factors: stress, sedentary lifestyle, saturated fats in diet. - PT has been checking BP at home 150-106, 140/93; and was seen in th [facility] on 01/20/2018 and had BP of 145/93.  Overweight: Body mass index is 28.79 kg/m. Diet: frequent dining out, excessive fat intake, moderate sugar carbonated beverage intake and choosing foods inappropriate for diet consistency Exercise: not active Co-Morbid Conditions: dyslipidemias, GERD and hypertension; 2 or more of these conditions combined with BMI >30 is considered morbid obesity; is this diagnosis appropriate and/or added to patient's problem list? No - she is only overweight category, not obesity category   Patient Active Problem List   Diagnosis Date Noted  . Family history of colon cancer   . Family history of ovarian cancer   . Hyperlipidemia 09/01/2017  . Overweight (BMI 25.0-29.9) 09/01/2017  . Other allergic rhinitis 09/01/2017  . Gastroesophageal reflux disease without esophagitis 09/01/2017    Past Surgical History:  Procedure Laterality Date  . COLONOSCOPY  2010   Dr Vanetta Shawlh-WNL  . COLONOSCOPY W/ BIOPSIES  03/02/2018   hyperplastic polyp-repeat in 5 years/ Dr Norma Fredricksonoledo  . DILATION AND CURETTAGE OF UTERUS    . 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 TUBAL LIGATION      Family History  Problem Relation Age of Onset  . Colon cancer Father 1855  . Hypertension Father   .  Breast cancer Maternal Aunt 54  . Ovarian cancer Maternal Grandmother 75  . Hypertension Mother     Social History   Socioeconomic History  . Marital status: Married    Spouse name: Not on file  . Number of children: 1  . Years of education: Not on file  . Highest education level: Not on file  Occupational History  . Occupation:  Neurosurgeon  Social Needs  . Financial resource strain: Not on file  . Food insecurity:    Worry: Not on file    Inability: Not on file  . Transportation needs:    Medical: Not on file    Non-medical: Not on file  Tobacco Use  . Smoking status: Never Smoker  . Smokeless tobacco: Never Used  Substance and Sexual Activity  . Alcohol use: Yes    Alcohol/week: 0.0 oz    Comment: occasionally on weekend  . Drug use: No  . Sexual activity: Yes    Partners: Male    Birth control/protection: Post-menopausal  Lifestyle  . Physical activity:    Days per week: Not on file    Minutes per session: Not on file  . Stress: Not on file  Relationships  . Social connections:    Talks on phone: Not on file    Gets together: Not on file    Attends religious service: Not on file    Active member of club or organization: Not on file    Attends meetings of clubs or organizations: Not on file    Relationship status: Not on file  . Intimate partner violence:    Fear of current or ex partner: Not on file    Emotionally abused: Not on file    Physically abused: Not on file    Forced sexual activity: Not on file  Other Topics Concern  . Not on file  Social History Narrative  . Not on file     Current Outpatient Medications:  .  Azelastine-Fluticasone 137-50 MCG/ACT SUSP, Place 2 sprays into the nose daily., Disp: 23 g, Rfl: 3 .  fexofenadine (ALLEGRA) 180 MG tablet, Take 1 tablet (180 mg total) by mouth daily., Disp: 90 tablet, Rfl: 1 .  lovastatin (MEVACOR) 40 MG tablet, Take 1 tablet (40 mg total) by mouth at bedtime., Disp: 90 tablet, Rfl: 1 .  escitalopram (LEXAPRO) 10 MG tablet, Take 1 tablet (10 mg total) by mouth daily. Take 1/2 tablet for 7 days, then increase to 1 tablet, Disp: 30 tablet, Rfl: 0 .  hydrOXYzine (ATARAX/VISTARIL) 10 MG tablet, Take 1 tablet (10 mg total) by mouth every 8 (eight) hours as needed for anxiety. 20 tablets to last 60 days, Disp: 20 tablet, Rfl:  0 .  lisinopril (PRINIVIL,ZESTRIL) 10 MG tablet, Take 1 tablet (10 mg total) by mouth daily., Disp: 30 tablet, Rfl: 0 .  omeprazole (PRILOSEC) 40 MG capsule, Take 1 capsule (40 mg total) by mouth daily., Disp: 90 capsule, Rfl: 1  Allergies  Allergen Reactions  . Bee Venom     ROS Ten systems reviewed and is negative except as mentioned in HPI.  Objective  Vitals:   06/25/18 1512 06/25/18 1555  BP: (!) 160/90 (!) 138/96  Pulse: (!) 112 89  Resp: 16   Temp: 98.7 F (37.1 C)   TempSrc: Oral   SpO2: 98%   Weight: 173 lb (78.5 kg)   Height: 5\' 5"  (1.651 m)    Body mass index is 28.79 kg/m.  Physical Exam Constitutional:  Patient appears well-developed and well-nourished. No distress.  HENT: Head: Normocephalic and atraumatic. Ears: B TMs ok, no erythema or effusion; Nose: Nose normal. Mouth/Throat: Oropharynx is clear and moist. No oropharyngeal exudate.  Eyes: Conjunctivae and EOM are normal. Pupils are equal, round, and reactive to light. No scleral icterus.  Neck: Normal range of motion. Neck supple. No JVD present. No thyromegaly present.  Cardiovascular: Normal rate, regular rhythm and normal heart sounds.  No murmur heard. No BLE edema. Pulmonary/Chest: Effort normal and breath sounds normal. No respiratory distress Neurological: she is alert and oriented to person, place, and time. No cranial nerve deficit. Coordination, balance, strength, speech and gait are normal.  Skin: Skin is warm and dry. No rash noted. No erythema. Patch of coarse hair noted under chin. Psychiatric: Patient has an anxious mood and affect. behavior is appropriate. Judgment and thought content normal.  No results found for this or any previous visit (from the past 72 hour(s)).   PHQ2/9: Depression screen [facility] 2/9 06/25/2018 06/25/2018 09/01/2017 12/14/2015  Decreased Interest 2 0 0 0  Down, Depressed, Hopeless 1 0 0 0  PHQ - 2 Score 3 0 0 0  Altered sleeping 1 - - -  Tired, decreased energy 1 - - -   Change in appetite 1 - - -  Feeling bad or failure about yourself  2 - - -  Trouble concentrating 3 - - -  Moving slowly or fidgety/restless 3 - - -  Suicidal thoughts 0 - - -  PHQ-9 Score 14 - - -  Difficult doing work/chores Somewhat difficult - - -   Fall Risk: Fall Risk  06/25/2018 09/01/2017 12/14/2015  Falls in the past year? No No No   Assessment & Plan   1. Current moderate episode of major depressive disorder without prior episode (HCC) - escitalopram (LEXAPRO) 10 MG tablet; Take 1 tablet (10 mg total) by mouth daily. Take 1/2 tablet for 7 days, then increase to 1 tablet  Dispense: 30 tablet; Refill: 0 - hydrOXYzine (ATARAX/VISTARIL) 10 MG tablet; Take 1 tablet (10 mg total) by mouth every 8 (eight) hours as needed for anxiety. 20 tablets to last 60 days  Dispense: 20 tablet; Refill: 0 -See AVS regarding discussion on tips for anxiety  2. Gastroesophageal reflux disease without esophagitis - omeprazole (PRILOSEC) 40 MG capsule; Take 1 capsule (40 mg total) by mouth daily.  Dispense: 90 capsule; Refill: 1 - discussed triggers, elevated HOB - see AVS  4. Essential hypertension - TSH - COMPLETE METABOLIC PANEL WITH GFR - CBC w/Diff/Platelet - lisinopril (PRINIVIL,ZESTRIL) 10 MG tablet; Take 1 tablet (10 mg total) by mouth daily.  Dispense: 30 tablet; Refill: 0 - DASH diet discussed - We will start on low dose lisinopril and recheck at a 2 week follow up.  5. Hyperlipidemia, unspecified hyperlipidemia type - Lipid panel - Pt will return tomorrow morning fasting for labs.  6. Overweight (BMI 25.0-29.9) - Discussed importance of 150 minutes of physical activity weekly, eat two servings of fish weekly, eat one serving of tree nuts ( cashews, pistachios, pecans, almonds.Marland Kitchen) every other day, eat 6 servings of fruit/vegetables daily and drink plenty of water and avoid sweet beverages.  - TSH - Lipid panel - COMPLETE METABOLIC PANEL WITH GFR  7. Anxiety - escitalopram (LEXAPRO)  10 MG tablet; Take 1 tablet (10 mg total) by mouth daily. Take 1/2 tablet for 7 days, then increase to 1 tablet  Dispense: 30 tablet; Refill: 0 - hydrOXYzine (ATARAX/VISTARIL) 10 MG  tablet; Take 1 tablet (10 mg total) by mouth every 8 (eight) hours as needed for anxiety. 20 tablets to last 60 days  Dispense: 20 tablet; Refill: 0 - See AVS for additional instructions/tips for anxiety.  8. Need for Tdap vaccination - Tdap vaccine greater than or equal to 7yo IM  9. Encounter for screening for HIV - HIV antibody   Return in about 2 weeks (around 07/09/2018) for BP/Anxiety follow up.

## 2018-06-26 ENCOUNTER — Telehealth: Payer: Self-pay | Admitting: Family Medicine

## 2018-06-26 NOTE — Telephone Encounter (Signed)
Pt reports having had colonoscopy performed this year; please request records from [facility]. Thanks!

## 2018-06-26 NOTE — Telephone Encounter (Signed)
Patient is not in [facility] system. Spoke to patient she will check her records at home and call back with provider name

## 2018-06-27 ENCOUNTER — Telehealth: Payer: Self-pay | Admitting: Family Medicine

## 2018-06-27 LAB — COMPLETE METABOLIC PANEL WITH GFR
AG Ratio: 1.7 (calc) (ref 1.0–2.5)
ALKALINE PHOSPHATASE (APISO): 105 U/L (ref 33–130)
ALT: 18 U/L (ref 6–29)
AST: 20 U/L (ref 10–35)
Albumin: 4.6 g/dL (ref 3.6–5.1)
BILIRUBIN TOTAL: 0.6 mg/dL (ref 0.2–1.2)
BUN/Creatinine Ratio: 10 (calc) (ref 6–22)
BUN: 11 mg/dL (ref 7–25)
CHLORIDE: 99 mmol/L (ref 98–110)
CO2: 29 mmol/L (ref 20–32)
Calcium: 9.7 mg/dL (ref 8.6–10.4)
Creat: 1.06 mg/dL — ABNORMAL HIGH (ref 0.50–1.05)
GFR, Est African American: 71 mL/min/{1.73_m2} (ref >=60)
GFR, Est Non African American: 61 mL/min/{1.73_m2} (ref >=60)
GLUCOSE: 87 mg/dL (ref 65–99)
Globulin: 2.7 g/dL (calc) (ref 1.9–3.7)
POTASSIUM: 3.7 mmol/L (ref 3.5–5.3)
Sodium: 138 mmol/L (ref 135–146)
Total Protein: 7.3 g/dL (ref 6.1–8.1)

## 2018-06-27 LAB — CBC WITH DIFFERENTIAL/PLATELET
BASOS ABS: 51 {cells}/uL (ref 0–200)
BASOS PCT: 0.8 %
EOS ABS: 122 {cells}/uL (ref 15–500)
Eosinophils Relative: 1.9 %
HCT: 45 % (ref 35.0–45.0)
Hemoglobin: 15.2 g/dL (ref 11.7–15.5)
Lymphs Abs: 1651 cells/uL (ref 850–3900)
MCH: 29.7 pg (ref 27.0–33.0)
MCHC: 33.8 g/dL (ref 32.0–36.0)
MCV: 87.9 fL (ref 80.0–100.0)
MONOS PCT: 6.4 %
MPV: 12.1 fL (ref 7.5–12.5)
NEUTROS PCT: 65.1 %
Neutro Abs: 4166 cells/uL (ref 1500–7800)
PLATELETS: 225 10*3/uL (ref 140–400)
RBC: 5.12 10*6/uL — ABNORMAL HIGH (ref 3.80–5.10)
RDW: 12.7 % (ref 11.0–15.0)
TOTAL LYMPHOCYTE: 25.8 %
WBC: 6.4 10*3/uL (ref 3.8–10.8)
WBCMIX: 410 {cells}/uL (ref 200–950)

## 2018-06-27 LAB — LIPID PANEL
CHOL/HDL RATIO: 2.9 (calc) (ref <5.0)
Cholesterol: 215 mg/dL — ABNORMAL HIGH (ref <200)
HDL: 75 mg/dL (ref >50)
LDL CHOLESTEROL (CALC): 115 mg/dL — AB
Non-HDL Cholesterol (Calc): 140 mg/dL (calc) — ABNORMAL HIGH (ref <130)
TRIGLYCERIDES: 141 mg/dL (ref <150)

## 2018-06-27 LAB — TSH: TSH: 1.95 mIU/L

## 2018-06-27 LAB — HIV ANTIBODY (ROUTINE TESTING W REFLEX): HIV 1&2 Ab, 4th Generation: NONREACTIVE

## 2018-06-27 NOTE — Telephone Encounter (Signed)
Copied from CRM [id number]. Topic: Quick Communication - Rx Refill/Question >> Jun 27, 2018  8:31 AM Maia Pettiesrtiz, Kristie S wrote: Medication: omeprazole - pt states the pharmacy and insurance have advised her prior Berkley Harveyauth is required - pt is requesting call with status update on PA

## 2018-06-29 NOTE — Telephone Encounter (Signed)
[facility] claims are through [facility] at [phone].

## 2018-06-29 NOTE — Telephone Encounter (Signed)
Sp[facility] to [employer] and will have a response sent to us in 3 to 5 days

## 2018-06-29 NOTE — Telephone Encounter (Signed)
Sent PA to [employer] and Cover my Meds patient not in system. Spoke to patient she asked me to called [facility] [employer]

## 2018-07-02 ENCOUNTER — Ambulatory Visit: Payer: Commercial Managed Care - PPO | Admitting: Family Medicine

## 2018-07-03 ENCOUNTER — Telehealth: Payer: Self-pay | Admitting: Family Medicine

## 2018-07-03 DIAGNOSIS — I10 Essential (primary) hypertension: Secondary | ICD-10-CM

## 2018-07-03 MED ORDER — CHLORTHALIDONE 25 MG PO TABS: 12.5000 mg | ORAL_TABLET | Freq: Every day | ORAL | 0 refills | Status: DC

## 2018-07-03 NOTE — Telephone Encounter (Signed)
Insurance denied Omeprazole but will cover Pantoprazole. Called to inform patient about medication change but voicemail box was full.

## 2018-07-03 NOTE — Telephone Encounter (Signed)
Discontinue lisinopril; start chlorthalidone - take 1/2 tablet (12.5mg ) daily.  Keep follow up on August 16. We will recheck BP and labs at that time.

## 2018-07-03 NOTE — Telephone Encounter (Signed)
Copied from CRM [phone]. Topic: Quick Communication - See Telephone Encounter >> Jul 03, 2018  8:46 AM Jolayne Hainesaylor, Brittany L wrote: CRM for notification. See Telephone encounter for: 07/03/18.  Patient states her new blood pressure medication lisinopril (PRINIVIL,ZESTRIL) 10 MG tablet that Irving Burtonmily just put her on has done nothing but make her cough. She said that she has been coughing since Friday. She said that it is one of the side effects and really wants to try something else in place of this. Please advise.   [facility] [address] [place]  [address] [place] [id number]

## 2018-07-03 NOTE — Telephone Encounter (Signed)
Please advise 

## 2018-07-04 ENCOUNTER — Other Ambulatory Visit: Payer: Self-pay | Admitting: Family Medicine

## 2018-07-04 MED ORDER — PANTOPRAZOLE SODIUM 40 MG PO TBEC: 40.0000 mg | DELAYED_RELEASE_TABLET | Freq: Every day | ORAL | 3 refills | Status: DC

## 2018-07-04 NOTE — Telephone Encounter (Signed)
Patient notified

## 2018-07-10 ENCOUNTER — Ambulatory Visit: Payer: Commercial Managed Care - PPO | Admitting: Family Medicine

## 2018-07-17 ENCOUNTER — Encounter: Payer: Self-pay | Admitting: Family Medicine

## 2018-07-17 ENCOUNTER — Ambulatory Visit (INDEPENDENT_AMBULATORY_CARE_PROVIDER_SITE_OTHER): Payer: Commercial Managed Care - PPO | Admitting: Family Medicine

## 2018-07-17 VITALS — BP 128/72 | HR 100 | Temp 98.4°F | Resp 16 | Ht 65.0 in | Wt 162.4 lb

## 2018-07-17 DIAGNOSIS — I10 Essential (primary) hypertension: Secondary | ICD-10-CM

## 2018-07-17 DIAGNOSIS — Z23 Encounter for immunization: Secondary | ICD-10-CM

## 2018-07-17 DIAGNOSIS — E663 Overweight: Secondary | ICD-10-CM | POA: Diagnosis not present

## 2018-07-17 DIAGNOSIS — F321 Major depressive disorder, single episode, moderate: Secondary | ICD-10-CM | POA: Diagnosis not present

## 2018-07-17 DIAGNOSIS — K219 Gastro-esophageal reflux disease without esophagitis: Secondary | ICD-10-CM | POA: Diagnosis not present

## 2018-07-17 LAB — BASIC METABOLIC PANEL WITH GFR
BUN/Creatinine Ratio: 13 (calc) (ref 6–22)
BUN: 15 mg/dL (ref 7–25)
CALCIUM: 9.9 mg/dL (ref 8.6–10.4)
CHLORIDE: 100 mmol/L (ref 98–110)
CO2: 27 mmol/L (ref 20–32)
Creat: 1.13 mg/dL — ABNORMAL HIGH (ref 0.50–1.05)
GFR, Est African American: 66 mL/min/{1.73_m2} (ref >=60)
GFR, Est Non African American: 57 mL/min/{1.73_m2} — ABNORMAL LOW (ref >=60)
GLUCOSE: 98 mg/dL (ref 65–99)
Potassium: 3.6 mmol/L (ref 3.5–5.3)
Sodium: 139 mmol/L (ref 135–146)

## 2018-07-17 NOTE — Progress Notes (Signed)
Name: Cassidy Collins   MRN: [id number]    DOB: 07/18/1966   Date:07/17/2018       Progress Note  Subjective  Chief Complaint  Chief Complaint  Patient presents with  . Panic Attack    2 week recheck    HPI  Pt presents to follow up on the following:  Anxiety/Depression: She had a co-worker pass suddenly last week and her son just moved away to college.  PHQ-9 score last visit was 14.  Started on lexapro - did have some odd feeling at first with this, but has felt calmer on the medication.  Her PHQ-9 score today is a 6.  HTN:  She was started on chlorthalidone 12.5mg  (1/2 tablet) daily and she is feeling well on this.  She is under a lot of stress right now - see above.  BP is improved from last visit and is at goal today. - no TIAs, no chest pain on exertion, no dyspnea on exertion, no swelling of ankles, no orthostatic dizziness or lightheadedness, no palpitations - DASH diet discussed - trying to follow a low sodium diet now. - The following  are contributing factors: stress; trying to be more active. - PT has been checking BP at home and they have been running 120's/70-80's.  Overweight: She stopped alcohol, sweets, and is drinking significantly less sweet beverages, and she is exercising more often.  She is trying to eat more fresh fruits and vegetables.  She is down 9lbs since last visit.  GERD: - Current medication regimen: was Rx'd omeprazole, but it was not approved; we wrote for pantoprazole instead, but she states this was still not filled.  We will check on this Rx today. - Possible Triggers: no or minimal alcohol (she stopped drinking alcohol 2 weeks ago; was drinking on the weekends only), no or mild caffeine use, using NSAID's regularly (excedrine) alcohol, caffeine, lying down after eating and tomato sauce - Denies: abdominal pain, choking/dysphagia, chest discomfort cough, weight loss, dysphagia, black stools, diarrhea, constipation or history of GI  bleeding  Health Maintenance: Will give flu shot today.  Patient Active Problem List   Diagnosis Date Noted  . Family history of colon cancer   . Family history of ovarian cancer   . Hyperlipidemia 09/01/2017  . Overweight (BMI 25.0-29.9) 09/01/2017  . Other allergic rhinitis 09/01/2017  . Gastroesophageal reflux disease without esophagitis 09/01/2017    Past Surgical History:  Procedure Laterality Date  . COLONOSCOPY  2010   Dr Vanetta Shawlh-WNL  . COLONOSCOPY W/ BIOPSIES  03/02/2018   hyperplastic polyp-repeat in 5 years/ Dr Norma Fredricksonoledo  . DILATION AND CURETTAGE OF UTERUS    . 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 TUBAL LIGATION      Family History  Problem Relation Age of Onset  . Colon cancer Father 7255  . Hypertension Father   . Breast cancer Maternal Aunt 6669  . Ovarian cancer Maternal Grandmother 75  . Hypertension Mother     Social History   Socioeconomic History  . Marital status: Married    Spouse name: Not on file  . Number of children: 1  . Years of education: Not on file  . Highest education level: Not on file  Occupational History  . Occupation: NeurosurgeonClaims Coder Specialist  Social Needs  . Financial resource strain: Not on file  . Food insecurity:    Worry: Not on file    Inability: Not on file  . Transportation needs:  Medical: Not on file    Non-medical: Not on file  Tobacco Use  . Smoking status: Never Smoker  . Smokeless tobacco: Never Used  Substance and Sexual Activity  . Alcohol use: Yes    Alcohol/week: 0.0 standard drinks    Comment: occasionally on weekend  . Drug use: No  . Sexual activity: Yes    Partners: Male    Birth control/protection: Post-menopausal  Lifestyle  . Physical activity:    Days per week: Not on file    Minutes per session: Not on file  . Stress: Not on file  Relationships  . Social connections:    Talks on phone: Not on file    Gets together: Not on file    Attends religious service: Not on file    Active  member of club or organization: Not on file    Attends meetings of clubs or organizations: Not on file    Relationship status: Not on file  . Intimate partner violence:    Fear of current or ex partner: Not on file    Emotionally abused: Not on file    Physically abused: Not on file    Forced sexual activity: Not on file  Other Topics Concern  . Not on file  Social History Narrative  . Not on file     Current Outpatient Medications:  .  Azelastine-Fluticasone 137-50 MCG/ACT SUSP, Place 2 sprays into the nose daily., Disp: 23 g, Rfl: 3 .  chlorthalidone (HYGROTON) 25 MG tablet, Take 0.5 tablets (12.5 mg total) by mouth daily. To replace lisinopril, Disp: 30 tablet, Rfl: 0 .  escitalopram (LEXAPRO) 10 MG tablet, Take 1 tablet (10 mg total) by mouth daily. Take 1/2 tablet for 7 days, then increase to 1 tablet, Disp: 30 tablet, Rfl: 0 .  fexofenadine (ALLEGRA) 180 MG tablet, Take 1 tablet (180 mg total) by mouth daily., Disp: 90 tablet, Rfl: 1 .  hydrOXYzine (ATARAX/VISTARIL) 10 MG tablet, Take 1 tablet (10 mg total) by mouth every 8 (eight) hours as needed for anxiety. 20 tablets to last 60 days, Disp: 20 tablet, Rfl: 0 .  lovastatin (MEVACOR) 40 MG tablet, Take 1 tablet (40 mg total) by mouth at bedtime., Disp: 90 tablet, Rfl: 1 .  pantoprazole (PROTONIX) 40 MG tablet, Take 1 tablet (40 mg total) by mouth daily., Disp: 30 tablet, Rfl: 3  Allergies  Allergen Reactions  . Bee Venom     ROS Constitutional: Negative for fever or weight change.  Respiratory: Negative for cough and shortness of breath.   Cardiovascular: Negative for chest pain or palpitations.  Gastrointestinal: Negative for abdominal pain, no bowel changes.  Musculoskeletal: Negative for gait problem or joint swelling.  Skin: Negative for rash.  Neurological: Negative for dizziness or headache.  No other specific complaints in a complete review of systems (except as listed in HPI above).  Objective  Vitals:    07/17/18 0743  BP: 128/72  Pulse: 100  Resp: 16  Temp: 98.4 F (36.9 C)  TempSrc: Oral  SpO2: 94%  Weight: 162 lb 6.4 oz (73.7 kg)  Height: 5\' 5"  (1.651 m)    Body mass index is 27.02 kg/m.  Physical Exam Constitutional: Patient appears well-developed and well-nourished. No distress.  HENT: Head: Normocephalic and atraumatic.  Eyes: Conjunctivae and EOM are normal. Pupils are equal, round, and reactive to light. No scleral icterus.  Neck: Normal range of motion. Neck supple. No JVD present. No thyromegaly present.  Cardiovascular: Normal rate, regular rhythm  and normal heart sounds.  No murmur heard. No BLE edema. Pulmonary/Chest: Effort normal and breath sounds normal. No respiratory distress. Abdominal: Soft. Bowel sounds are normal, no distension. There is no tenderness. no masses Musculoskeletal: Normal range of motion, no joint effusions. No gross deformities Neurological: she is alert and oriented to person, place, and time. No cranial nerve deficit. Coordination, balance, strength, speech and gait are normal.  Skin: Skin is warm and dry. No rash noted. No erythema.  Psychiatric: Patient has a normal mood and affect. behavior is normal. Judgment and thought content normal.  No results found for this or any previous visit (from the past 72 hour(s)).   PHQ2/9: Depression screen [facility] 2/9 07/17/2018 06/25/2018 06/25/2018 09/01/2017 12/14/2015  Decreased Interest 1 2 0 0 0  Down, Depressed, Hopeless 1 1 0 0 0  PHQ - 2 Score 2 3 0 0 0  Altered sleeping - 1 - - -  Tired, decreased energy 1 1 - - -  Change in appetite 0 1 - - -  Feeling bad or failure about yourself  1 2 - - -  Trouble concentrating 0 3 - - -  Moving slowly or fidgety/restless 0 3 - - -  Suicidal thoughts 0 0 - - -  PHQ-9 Score - 14 - - -  Difficult doing work/chores Somewhat difficult Somewhat difficult - - -   Fall Risk: Fall Risk  07/17/2018 06/25/2018 09/01/2017 12/14/2015  Falls in the past year? No No No No    Assessment & Plan  1. Overweight (BMI 25.0-29.9) - Down 9lbs since last visit; continue current management.  2. Essential hypertension -  Continue Chlorthalidone - BASIC METABOLIC PANEL WITH GFR  3. 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 Gastroesophageal reflux disease without esophagitis - Will check on protonix Rx - Avoid triggers  5. Needs flu shot - Flu Vaccine QUAD 6+ mos PF IM (Fluarix Quad PF)

## 2018-07-24 ENCOUNTER — Other Ambulatory Visit: Payer: Self-pay

## 2018-07-24 MED ORDER — PANTOPRAZOLE SODIUM 40 MG PO TBEC: 80.0000 mg | DELAYED_RELEASE_TABLET | Freq: Two times a day (BID) | ORAL | 0 refills | Status: DC

## 2018-07-24 NOTE — Telephone Encounter (Signed)
Per ins and latish it looks like ins covers the 80 mg, please change

## 2018-07-25 ENCOUNTER — Other Ambulatory Visit: Payer: Self-pay | Admitting: Family Medicine

## 2018-07-25 NOTE — Telephone Encounter (Signed)
Refill request for general medication. Pantoprazole  Last office visit 07/17/2018   Follow up on 01/17/2019

## 2018-08-24 ENCOUNTER — Telehealth: Payer: Self-pay

## 2018-08-24 NOTE — Telephone Encounter (Signed)
We should have what is needed on file for her.  I will complete papers for her once they're delivered.

## 2018-08-24 NOTE — Telephone Encounter (Signed)
Copied from CRM [id number]. 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 She states she needs this by Monday if possible.

## 2018-08-24 NOTE — Telephone Encounter (Signed)
Paperwork complete. Need signature only

## 2018-09-04 ENCOUNTER — Other Ambulatory Visit: Payer: Self-pay | Admitting: Family Medicine

## 2018-09-04 DIAGNOSIS — E785 Hyperlipidemia, unspecified: Secondary | ICD-10-CM

## 2018-09-04 DIAGNOSIS — I10 Essential (primary) hypertension: Secondary | ICD-10-CM

## 2018-09-24 ENCOUNTER — Telehealth: Payer: Self-pay

## 2018-09-24 NOTE — Telephone Encounter (Signed)
Pt is calling to see if we will put a mammo request in for her before she has her annual done. I have called the pt and advised her that we dont put them in until we see them to make sure we are putting the right order in. Pt seemed upset that we could not do that and said she will be changing back to colleen sense she will do that for her. Pt is to see Helmut Muster on 10/15/18 for annual. Thank you

## 2018-09-27 ENCOUNTER — Ambulatory Visit: Payer: Self-pay | Admitting: Certified Nurse Midwife

## 2018-09-27 NOTE — Telephone Encounter (Signed)
Pt would like for mammo order to be put in please. Huntley Dec, can you please reschedule this pt for 11/19.

## 2018-09-27 NOTE — Telephone Encounter (Signed)
Pls let pt know mammo not due till 10/16/18 (has to be a year in between them). Appt sched 10/15/18. Can change annual appt to 10/16/18 to do same day if she prefers. Fine to put order in (disagree with [facility]

## 2018-09-27 NOTE — Telephone Encounter (Signed)
Patient is reschedule to 10/16/18

## 2018-09-28 ENCOUNTER — Other Ambulatory Visit: Payer: Self-pay | Admitting: Obstetrics and Gynecology

## 2018-09-28 DIAGNOSIS — Z1239 Encounter for other screening for malignant neoplasm of breast: Secondary | ICD-10-CM

## 2018-09-28 NOTE — Progress Notes (Signed)
Mammo order to do same day as annual.

## 2018-09-28 NOTE — Telephone Encounter (Signed)
Pls let pt know mammo order in and she needs to call them to sched appt.  [facility] at [facility]: [phone] Thx

## 2018-10-01 NOTE — Telephone Encounter (Signed)
Pt aware, gave her information.

## 2018-10-04 ENCOUNTER — Encounter: Payer: Self-pay | Admitting: Family Medicine

## 2018-10-04 ENCOUNTER — Telehealth: Payer: Self-pay | Admitting: Family Medicine

## 2018-10-04 ENCOUNTER — Ambulatory Visit (INDEPENDENT_AMBULATORY_CARE_PROVIDER_SITE_OTHER): Payer: Commercial Managed Care - PPO | Admitting: Family Medicine

## 2018-10-04 VITALS — BP 122/76 | HR 92 | Temp 98.4°F | Resp 16 | Ht 65.0 in | Wt 168.0 lb

## 2018-10-04 DIAGNOSIS — F321 Major depressive disorder, single episode, moderate: Secondary | ICD-10-CM | POA: Diagnosis not present

## 2018-10-04 DIAGNOSIS — F419 Anxiety disorder, unspecified: Secondary | ICD-10-CM

## 2018-10-04 MED ORDER — BUSPIRONE HCL 7.5 MG PO TABS: ORAL_TABLET | ORAL | 0 refills | Status: DC

## 2018-10-04 MED ORDER — ESCITALOPRAM OXALATE 20 MG PO TABS: 20.0000 mg | ORAL_TABLET | Freq: Every day | ORAL | 1 refills | Status: DC

## 2018-10-04 NOTE — Patient Instructions (Addendum)
Buspar: Take 1 tablet each night x7 days, then increase to twice daily (morning and night) x7 days.  After this, you may take a third dose as needed during the day for anxiety.  Stop hydroxyzine.  Take 1.5 tablets of your 10mg  Lexapro (total of 15mg ) until you're out of the 10mg  tablets (about 3-4 days). Then switch to 1 tablet daily of the 20mg  tablets.  Counseling - psychologytoday.com Therapist finder    Here are some resources to help you if you feel you are in a mental health crisis:  National Suicide Prevention Lifeline - Call (825)845-4117  for help - Website with more resources: ARanked.fi  [facility] - Call 332-366-1668 for help. - Mobile Crisis Program available 24 hours a day, 365 days a year. - Available for anyone of any age in [place] & [place] counties.  [facility] - Address: [address], [place] - Telephone: [phone]  - Hours of Operation: Sunday - Saturday - 8:00 a.m. - 8:00 p.m. - Medicaid, Medicare (Government Issued Only), BCBS, and Cash - Pay - Crisis Management, Outpatient Individual & Group Therapy, Psychiatrists on-site to provide medication management, In-Home Psychiatric Care, and Peer Support Care.  [facility] - Call 1-877-626-1772 for help. - Mobile Crisis Program available 24 hours a day, 365 days a year. - Available for anyone of any age in  & [place]     12  Ways to Curb Anxiety  ?Anxiety is normal human sensation. Anxiety can be triggered by a very personal issue, such as the illness of a loved one, or an event of global proportions, such as a refugee crisis. Some of the symptoms of anxiety are:  Feeling restless.  Having a feeling of impending danger.  Increased heart rate.  Rapid breathing. Sweating.  Shaking.  Weakness or feeling tired.  Difficulty concentrating on anything except the current worry.   Insomnia.  Stomach or bowel problems. What can we do about anxiety we may be feeling? There are many techniques to help manage stress and relax. Here are 12 ways you can reduce your anxiety almost immediately: 1. Turn off the constant feed of information. Take a social media sabbatical. Studies have shown that social media directly contributes to social anxiety.  2. Monitor your television viewing habits. Are you watching shows that are also contributing to your anxiety, such as 24-hour news stations? Try watching something else, or better yet, nothing at all. Instead, listen to music, read an inspirational book or practice a hobby. 3. Eat nutritious meals. Also, don't skip meals and keep healthful snacks on hand. Hunger and poor diet contributes to feeling anxious. 4. Sleep. Sleeping on a regular schedule for at least seven to eight hours a night will do wonders for your outlook when you are awake. 5. Exercise. Regular exercise will help rid your body of that anxious energy and help you get more restful sleep. 6. Try deep (diaphragmatic) breathing. Inhale slowly through your nose for five seconds and exhale through your mouth. 7. Practice acceptance and gratitude. When anxiety hits, accept that there are things out of your control that shouldn't be of immediate concern.  8. Seek out humor. When anxiety strikes, watch a funny video, read jokes or call a friend who makes you laugh. Laughter is healing for our bodies and releases endorphins that are calming. 9. Stay positive. Take the effort to replace negative thoughts with positive ones. Try to see a stressful situation in a positive light.  Try to come up with solutions rather than dwelling on the problem. 10. Figure out what triggers your anxiety. Keep a journal and make note of anxious moments and the events surrounding them. This will help you identify triggers you can avoid or even eliminate. 11. Talk to someone. Let a trusted friend, family  member or even trained professional know that you are feeling overwhelmed and anxious. Verbalize what you are feeling and why.  12. Volunteer. If your anxiety is triggered by a crisis on a large scale, become an advocate and work to resolve the problem that is causing you unease. Anxiety is often unwelcome and can become overwhelming. If not kept in check, it can become a disorder that could require medical treatment. However, if you take the time to care for yourself and avoid the triggers that make you anxious, you will be able to find moments of relaxation and clarity that make your life much more enjoyable.

## 2018-10-04 NOTE — Telephone Encounter (Signed)
I will sent referral to [facility],  They can get her in within the month.  That was the soonest by far.

## 2018-10-04 NOTE — Progress Notes (Addendum)
Name: Cassidy Collins   MRN: [id number]    DOB: 23-Jul-1966   Date:10/04/2018       Progress Note  Subjective  Chief Complaint  Chief Complaint  Patient presents with  . Anxiety    HPI  Pt presents to request FMLA paperwork for her anxiety and depression.  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  Her job has been making a lot of changes lately, there is a lot of negativity at work right now. At last visit she noted a co-worker had passed away suddenly and this really stressed her as well.  Her son is also causing her stress right now.  Initial PHQ-9 on 06/25/2018 was 14, after starting lexapro 10mg  daily and hydroxyzine 10mg  PRN her 1 month follow up showed improvement to PHQ-9 score of 6 on 07/17/18; today her score is back up to a 16.  We will refer to psychiatry, paperwork to be completed to allow her to take breaks if needed and to take off for appointments.  - We will stop hydroxyzine and switch to Buspar - will taper up to twice daily with additional dose PRN.  Increase lexapro to 20mg  daily.  GAD7 score today is 15.  Denies SI/HI - She denies panic attacks, but states she has episodes where she has trouble calming down, needs to go outside to collect herself - feels very irritable and jittery.  Patient Active Problem List   Diagnosis Date Noted  . Family history of colon cancer   . Family history of ovarian cancer   . Hyperlipidemia 09/01/2017  . Overweight (BMI 25.0-29.9) 09/01/2017  . Other allergic rhinitis 09/01/2017  . Gastroesophageal reflux disease without esophagitis 09/01/2017    Past Surgical History:  Procedure Laterality Date  . COLONOSCOPY  2010   Dr Vanetta Shawl  . COLONOSCOPY W/ BIOPSIES  03/02/2018   hyperplastic polyp-repeat in 5 years/ Dr Norma Fredrickson  . DILATION AND CURETTAGE OF UTERUS    . 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 TUBAL LIGATION      Family History   Problem Relation Age of Onset  . Colon cancer Father 84  . Hypertension Father   . Breast cancer Maternal Aunt 47  . Ovarian cancer Maternal Grandmother 75  . Hypertension Mother     Social History   Socioeconomic History  . Marital status: Married    Spouse name: Not on file  . Number of children: 1  . Years of education: Not on file  . Highest education level: Not on file  Occupational History  . Occupation: Neurosurgeon  Social Needs  . Financial resource strain: Not on file  . Food insecurity:    Worry: Not on file    Inability: Not on file  . Transportation needs:    Medical: Not on file    Non-medical: Not on file  Tobacco Use  . Smoking status: Never Smoker  . Smokeless tobacco: Never Used  Substance and Sexual Activity  . Alcohol use: Yes    Alcohol/week: 0.0 standard drinks    Comment: occasionally on weekend  . Drug use: No  . Sexual activity: Yes    Partners: Male    Birth control/protection: Post-menopausal  Lifestyle  . Physical activity:    Days per week: Not on file    Minutes per session: Not on file  . Stress: Not  on file  Relationships  . Social connections:    Talks on phone: Not on file    Gets together: Not on file    Attends religious service: Not on file    Active member of club or organization: Not on file    Attends meetings of clubs or organizations: Not on file    Relationship status: Not on file  . Intimate partner violence:    Fear of current or ex partner: Not on file    Emotionally abused: Not on file    Physically abused: Not on file    Forced sexual activity: Not on file  Other Topics Concern  . Not on file  Social History Narrative  . Not on file     Current Outpatient Medications:  .  Azelastine-Fluticasone 137-50 MCG/ACT SUSP, Place 2 sprays into the nose daily., Disp: 23 g, Rfl: 3 .  chlorthalidone (HYGROTON) 25 MG tablet, TAKE 1/2 TABLET BY MOUTH ONCE DAILY( REPLACE LISINOPRL), Disp: 30 tablet, Rfl:  0 .  escitalopram (LEXAPRO) 10 MG tablet, Take 1 tablet (10 mg total) by mouth daily. Take 1/2 tablet for 7 days, then increase to 1 tablet, Disp: 30 tablet, Rfl: 0 .  fexofenadine (ALLEGRA) 180 MG tablet, Take 1 tablet (180 mg total) by mouth daily., Disp: 90 tablet, Rfl: 1 .  lovastatin (MEVACOR) 40 MG tablet, TAKE 1 TABLET BY MOUTH AT BEDTIME, Disp: 90 tablet, Rfl: 0 .  pantoprazole (PROTONIX) 40 MG tablet, Take 1 tablet (40 mg total) by mouth 2 (two) times daily before a meal., Disp: 60 tablet, Rfl: 0 .  hydrOXYzine (ATARAX/VISTARIL) 10 MG tablet, Take 1 tablet (10 mg total) by mouth every 8 (eight) hours as needed for anxiety. 20 tablets to last 60 days (Patient not taking: Reported on 10/04/2018), Disp: 20 tablet, Rfl: 0  Allergies  Allergen Reactions  . Bee Venom     I personally reviewed active problem list, medication list, allergies, health maintenance, notes from last encounter, lab results with the patient/caregiver today.   ROS Constitutional: Negative for fever or weight change.  Respiratory: Negative for cough and shortness of breath.   Cardiovascular: Negative for chest pain or palpitations.  Gastrointestinal: Negative for abdominal pain, no bowel changes.  Musculoskeletal: Negative for gait problem or joint swelling.  Skin: Negative for rash.  Neurological: Negative for dizziness or headache.  No other specific complaints in a complete review of systems (except as listed in HPI above).  Objective  Vitals:   10/04/18 0717  BP: 122/76  Pulse: 92  Resp: 16  Temp: 98.4 F (36.9 C)  TempSrc: Oral  SpO2: 98%  Weight: 168 lb (76.2 kg)  Height: 5\' 5"  (1.651 m)   Body mass index is 27.96 kg/m.  Physical Exam  Constitutional: Patient appears well-developed and well-nourished. No distress.  HENT: Head: Normocephalic and atraumatic. Neck: Normal range of motion. Neck supple. No JVD present. No thyromegaly present.  Cardiovascular: Normal rate, regular rhythm and  normal heart sounds.  No murmur heard. No BLE edema. Pulmonary/Chest: Effort normal and breath sounds normal. No respiratory distress. Musculoskeletal: Normal range of motion, no joint effusions. No gross deformities Neurological: Pt is alert and oriented to person, place, and time. No cranial nerve deficit. Coordination, balance, strength, speech and gait are normal.  Skin: Skin is warm and dry. No rash noted. No erythema.  Psychiatric: Patient has an anxious mood and affect. behavior is apprpriate. Judgment and thought content normal. Speech is moderately rapid/pressured.  No results  found for this or any previous visit (from the past 72 hour(s)).  GAD 7 : Generalized Anxiety Score 10/04/2018  Nervous, Anxious, on Edge 2  Control/stop worrying 1  Worry too much - different things 3  Trouble relaxing 2  Restless 1  Easily annoyed or irritable 3  Afraid - awful might happen 3  Total GAD 7 Score 15  Anxiety Difficulty Somewhat difficult   PHQ2/9: Depression screen [facility] 2/9 10/04/2018 07/17/2018 06/25/2018 06/25/2018 09/01/2017  Decreased Interest 2 1 2  0 0  Down, Depressed, Hopeless 3 1 1  0 0  PHQ - 2 Score 5 2 3  0 0  Altered sleeping 1 2 1  - -  Tired, decreased energy 1 1 1  - -  Change in appetite 3 0 1 - -  Feeling bad or failure about yourself  2 1 2  - -  Trouble concentrating 2 0 3 - -  Moving slowly or fidgety/restless 2 0 3 - -  Suicidal thoughts 0 0 0 - -  PHQ-9 Score 16 6 14  - -  Difficult doing work/chores Extremely dIfficult Somewhat difficult Somewhat difficult - -   Fall Risk: Fall Risk  10/04/2018 07/17/2018 06/25/2018 09/01/2017 12/14/2015  Falls in the past year? 0 No No No No  Injury with Fall? 0 - - - -     Assessment & Plan  1. Current moderate episode of major depressive disorder without prior episode (HCC) - busPIRone (BUSPAR) 7.5 MG tablet; Take 1 tablet at night x7 days; then take twice daily x7 days; then may take 3rd dose as needed during the day  Dispense: 90  tablet; Refill: 0 - Ambulatory referral to Psychiatry - escitalopram (LEXAPRO) 20 MG tablet; Take 1 tablet (20 mg total) by mouth daily.  Dispense: 30 tablet; Refill: 1  2. Anxiety - busPIRone (BUSPAR) 7.5 MG tablet; Take 1 tablet at night x7 days; then take twice daily x7 days; then may take 3rd dose as needed during the day  Dispense: 90 tablet; Refill: 0 - Ambulatory referral to Psychiatry - escitalopram (LEXAPRO) 20 MG tablet; Take 1 tablet (20 mg total) by mouth daily.  Dispense: 30 tablet; Refill: 1  - Advised FMLA paperwork will be completed, but it will likely not be approved until completed by a psychiatrist.  Advised she must obtain counseling and we will work on psychiatry referral.  She verbalizes understanding.  Face-to-face time with patient was more than 25 minutes, >50% time spent counseling and coordination of care

## 2018-10-04 NOTE — Telephone Encounter (Signed)
**  URGENT** Psychiatry referral for depression and anxiety needed/placed.  Please refer to whomever is able to see the patient soonest.  She is willing to go to [place] if needed.

## 2018-10-05 NOTE — Telephone Encounter (Signed)
Patient stated she will check with insurance coverage for appointment. FMLA paperwork faxed to patient

## 2018-10-15 ENCOUNTER — Ambulatory Visit: Payer: Self-pay | Admitting: Obstetrics and Gynecology

## 2018-10-16 ENCOUNTER — Other Ambulatory Visit (HOSPITAL_COMMUNITY)
Admission: RE | Admit: 2018-10-16 | Discharge: 2018-10-16 | Disposition: A | Discharge status: Home / Self Care | Payer: Commercial Managed Care - PPO | Source: Ambulatory Visit | Attending: Obstetrics and Gynecology | Admitting: Obstetrics and Gynecology

## 2018-10-16 ENCOUNTER — Ambulatory Visit (INDEPENDENT_AMBULATORY_CARE_PROVIDER_SITE_OTHER): Payer: Commercial Managed Care - PPO | Admitting: Obstetrics and Gynecology

## 2018-10-16 ENCOUNTER — Encounter: Payer: Self-pay | Admitting: Obstetrics and Gynecology

## 2018-10-16 VITALS — BP 118/80 | HR 85 | Ht 65.0 in | Wt 167.0 lb

## 2018-10-16 DIAGNOSIS — Z124 Encounter for screening for malignant neoplasm of cervix: Secondary | ICD-10-CM

## 2018-10-16 DIAGNOSIS — Z803 Family history of malignant neoplasm of breast: Secondary | ICD-10-CM

## 2018-10-16 DIAGNOSIS — Z800 Family history of malignant neoplasm of digestive organs: Secondary | ICD-10-CM

## 2018-10-16 DIAGNOSIS — R350 Frequency of micturition: Secondary | ICD-10-CM

## 2018-10-16 DIAGNOSIS — Z01419 Encounter for gynecological examination (general) (routine) without abnormal findings: Secondary | ICD-10-CM | POA: Diagnosis not present

## 2018-10-16 DIAGNOSIS — Z1239 Encounter for other screening for malignant neoplasm of breast: Secondary | ICD-10-CM

## 2018-10-16 DIAGNOSIS — Z8041 Family history of malignant neoplasm of ovary: Secondary | ICD-10-CM

## 2018-10-16 LAB — POCT URINALYSIS DIPSTICK
BILIRUBIN UA: NEGATIVE
GLUCOSE UA: NEGATIVE
Ketones, UA: NEGATIVE
Nitrite, UA: NEGATIVE
Protein, UA: NEGATIVE
RBC UA: NEGATIVE
Spec Grav, UA: 1.01 (ref 1.010–1.025)
pH, UA: 6 (ref 5.0–8.0)

## 2018-10-16 NOTE — Patient Instructions (Signed)
I value your feedback and entrusting us with your care. If you get a [facility] patient survey, I would appreciate you taking the time to let us know about your experience today. Thank you! 

## 2018-10-16 NOTE — Progress Notes (Signed)
PCP: Hubbard Hartshorn, FNP   Chief Complaint  Patient presents with  . Gynecologic Exam    about a month ago pt had to go urinate more than usual and noticed a little blood, also couldnt hold urine in, noticed she had urinary frequency after she started her blood pressure medicine,     HPI:      [name] is a 52 y.o. G1P1001 who LMP was No LMP recorded. Patient is postmenopausal., presents today for her annual examination.  Her menses are absent due to premature menopause (age 71). She does not have intermenstrual bleeding. She does not have vasomotor sx.   Sex activity: single partner, contraception - post menopausal status. She does not have vaginal dryness.  Last Pap: September 25, 2017  Results were: no abnormalities / 2017 neg HPV DNA.  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  Last mammogram: October 16, 2017  Results were: normal--routine follow-up in 12 months. Has appt sched 10/24/18. There is a FH of breast cancer in her mat aunt and ovarian cancer in her MGM. Pt's sister is BRCA neg. Pt has declined genetic testing in past. The patient does do self-breast exams.  Colonoscopy: 2019 Repeat due after 5 years. Father with colon cancer.   Tobacco use: The patient denies current or previous tobacco use. Alcohol use: social drinker Exercise: moderately active  She does get adequate calcium and Vitamin D in her diet.  Labs with PCP.  She has noticed urinary frequency/urgency since starting her new BP med about 6 wks ago. Had 1 episode hematuria. Sx improved if drinking lots of water. No dysuria/fevers/belly pain. No vag sx.   Past Medical History:  Diagnosis Date  . Anxiety 2019  . Family history of colon cancer   . Family history of ovarian cancer   . GERD (gastroesophageal reflux disease)   . History of abnormal cervical Pap smear    positive HRHPV  . History of mammogram 10/04/2016   neg  .  Hyperlipidemia     Past Surgical History:  Procedure Laterality Date  . COLONOSCOPY  2010   Dr Adair Laundry  . COLONOSCOPY W/ BIOPSIES  03/02/2018   hyperplastic polyp-repeat in 5 years/ Dr Alice Reichert  . DILATION AND CURETTAGE OF UTERUS    . 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 TUBAL LIGATION      Family History  Problem Relation Age of Onset  . Colon cancer Father 1  . Hypertension Father   . Breast cancer Maternal Aunt 69  . Ovarian cancer Maternal Grandmother 75  . Hypertension Mother   . Hypertension Sister     Social History   Socioeconomic History  . Marital status: Married    Spouse name: Not on file  . Number of children: 1  . Years of education: Not on file  . Highest education level: Not on file  Occupational History  . Occupation: Chiropodist  Social Needs  . Financial resource strain: Not on file  . Food insecurity:    Worry: Not on file    Inability: Not on file  . Transportation needs:    Medical: Not on file    Non-medical: Not on file  Tobacco Use  . Smoking status: Never Smoker  . Smokeless tobacco: Never Used  Substance and Sexual Activity  . Alcohol use: Yes    Alcohol/week: 0.0 standard drinks  Comment: occasionally on weekend  . Drug use: No  . Sexual activity: Yes    Partners: Male    Birth control/protection: Post-menopausal  Lifestyle  . Physical activity:    Days per week: Not on file    Minutes per session: Not on file  . Stress: Not on file  Relationships  . Social connections:    Talks on phone: Not on file    Gets together: Not on file    Attends religious service: Not on file    Active member of club or organization: Not on file    Attends meetings of clubs or organizations: Not on file    Relationship status: Not on file  . Intimate partner violence:    Fear of current or ex partner: Not on file    Emotionally abused: Not on file    Physically abused: Not on file    Forced sexual activity: Not on  file  Other Topics Concern  . Not on file  Social History Narrative  . Not on file    Outpatient Medications Prior to Visit  Medication Sig Dispense Refill  . Azelastine-Fluticasone 137-50 MCG/ACT SUSP Place 2 sprays into the nose daily. 23 g 3  . busPIRone (BUSPAR) 7.5 MG tablet Take 1 tablet at night x7 days; then take twice daily x7 days; then may take 3rd dose as needed during the day 90 tablet 0  . chlorthalidone (HYGROTON) 25 MG tablet TAKE 1/2 TABLET BY MOUTH ONCE DAILY( REPLACE LISINOPRL) 30 tablet 0  . escitalopram (LEXAPRO) 20 MG tablet Take 1 tablet (20 mg total) by mouth daily. 30 tablet 1  . fexofenadine (ALLEGRA) 180 MG tablet Take 1 tablet (180 mg total) by mouth daily. 90 tablet 1  . lovastatin (MEVACOR) 40 MG tablet TAKE 1 TABLET BY MOUTH AT BEDTIME 90 tablet 0  . pantoprazole (PROTONIX) 40 MG tablet Take 1 tablet (40 mg total) by mouth 2 (two) times daily before a meal. 60 tablet 0   No facility-administered medications prior to visit.        ROS:  Review of Systems  Constitutional: Negative for fatigue, fever and unexpected weight change.  Respiratory: Negative for cough, shortness of breath and wheezing.   Cardiovascular: Negative for chest pain, palpitations and leg swelling.  Gastrointestinal: Negative for blood in stool, constipation, diarrhea, nausea and vomiting.  Endocrine: Negative for cold intolerance, heat intolerance and polyuria.  Genitourinary: Positive for frequency and urgency. Negative for dyspareunia, dysuria, flank pain, genital sores, hematuria, menstrual problem, pelvic pain, vaginal bleeding, vaginal discharge and vaginal pain.  Musculoskeletal: Negative for back pain, joint swelling and myalgias.  Skin: Negative for rash.  Neurological: Negative for dizziness, syncope, light-headedness, numbness and headaches.  Hematological: Negative for adenopathy.  Psychiatric/Behavioral: Negative for confusion, sleep disturbance and suicidal ideas. The  patient is not nervous/anxious.   BREAST: No symptoms    Objective: BP 118/80   Pulse 85   Ht _0  (1.651 m)   Wt 167 lb (75.8 kg)   BMI 27.79 kg/m    Physical Exam  Constitutional: She is oriented to person, place, and time. She appears well-developed and well-nourished.  Genitourinary: Vagina normal and uterus normal. There is no rash or tenderness on the right labia. There is no rash or tenderness on the left labia. No erythema or tenderness in the vagina. No vaginal discharge found. Right adnexum does not display mass and does not display tenderness. Left adnexum does not display mass and does not display  tenderness. Cervix does not exhibit motion tenderness or polyp. Uterus is not enlarged or tender.  Neck: Normal range of motion. No thyromegaly present.  Cardiovascular: Normal rate, regular rhythm and normal heart sounds.  No murmur heard. Pulmonary/Chest: Effort normal and breath sounds normal. Right breast exhibits no mass, no nipple discharge, no skin change and no tenderness. Left breast exhibits no mass, no nipple discharge, no skin change and no tenderness.  Abdominal: Soft. There is no tenderness. There is no guarding.  Musculoskeletal: Normal range of motion.  Neurological: She is alert and oriented to person, place, and time. No cranial nerve deficit.  Psychiatric: She has a normal mood and affect. Her behavior is normal.  Vitals reviewed.   Results: Results for orders placed or performed in visit on 10/16/18 (from the past 24 hour(s))  POCT Urinalysis Dipstick     Status: Abnormal   Collection Time: 10/16/18  4:45 PM  Result Value Ref Range   Color, UA yellow    Clarity, UA clear    Glucose, UA Negative Negative   Bilirubin, UA neg    Ketones, UA neg    Spec Grav, UA 1.010 1.010 - 1.025   Blood, UA neg    pH, UA 6.0 5.0 - 8.0   Protein, UA Negative Negative   Urobilinogen, UA     Nitrite, UA neg    Leukocytes, UA Large (3+) (A) Negative   Appearance      Odor      Assessment/Plan:  Encounter for annual routine gynecological examination  Cervical cancer screening - Plan: Cytology - PAP  Screening for breast cancer - Pt has mammo sched  Urinary frequency - Questionable UA. No sx currently. Check C&S. Will call with results.  - Plan: POCT Urinalysis Dipstick, Urine Culture  Family history of breast cancer  Family history of ovarian cancer - MyRisk testing discussed and pt declines. F/u prn.   Family history of colon cancer - Pt has Q5 yr colonoscopies.         GYN counsel breast self exam, mammography screening, menopause, adequate intake of calcium and vitamin D, diet and exercise    F/U  Return in about 1 year (around 10/17/2019).  Alicia B. Copland, PA-C 10/16/2018 4:48 PM

## 2018-10-18 ENCOUNTER — Telehealth: Payer: Self-pay | Admitting: Obstetrics and Gynecology

## 2018-10-18 LAB — URINE CULTURE

## 2018-10-18 MED ORDER — NITROFURANTOIN MONOHYD MACRO 100 MG PO CAPS: 100.0000 mg | ORAL_CAPSULE | Freq: Two times a day (BID) | ORAL | 0 refills | Status: AC

## 2018-10-18 NOTE — Telephone Encounter (Signed)
Pt aware.

## 2018-10-18 NOTE — Telephone Encounter (Signed)
RN to notify pt she has UTI. Rx macrobid. F/u prn.

## 2018-10-19 LAB — CYTOLOGY - PAP
Adequacy: ABSENT
DIAGNOSIS: NEGATIVE

## 2018-10-24 ENCOUNTER — Encounter: Payer: Self-pay | Admitting: Obstetrics and Gynecology

## 2018-10-24 ENCOUNTER — Ambulatory Visit
Admission: RE | Admit: 2018-10-24 | Discharge: 2018-10-24 | Disposition: A | Discharge status: Home / Self Care | Payer: Commercial Managed Care - PPO | Source: Ambulatory Visit | Attending: Obstetrics and Gynecology | Admitting: Obstetrics and Gynecology

## 2018-10-24 DIAGNOSIS — Z1239 Encounter for other screening for malignant neoplasm of breast: Secondary | ICD-10-CM | POA: Diagnosis not present

## 2018-11-09 ENCOUNTER — Ambulatory Visit: Payer: Self-pay | Admitting: Psychiatry

## 2018-12-07 ENCOUNTER — Ambulatory Visit: Payer: Self-pay | Admitting: Psychiatry

## 2019-01-07 ENCOUNTER — Other Ambulatory Visit: Payer: Self-pay | Admitting: Family Medicine

## 2019-01-07 DIAGNOSIS — I10 Essential (primary) hypertension: Secondary | ICD-10-CM

## 2019-01-17 ENCOUNTER — Encounter: Payer: Self-pay | Admitting: Family Medicine

## 2019-01-17 ENCOUNTER — Ambulatory Visit: Payer: Commercial Managed Care - PPO | Admitting: Family Medicine

## 2019-01-17 VITALS — BP 124/82 | HR 82 | Temp 97.8°F | Resp 16 | Ht 65.0 in | Wt 164.7 lb

## 2019-01-17 DIAGNOSIS — I10 Essential (primary) hypertension: Secondary | ICD-10-CM

## 2019-01-17 DIAGNOSIS — F321 Major depressive disorder, single episode, moderate: Secondary | ICD-10-CM

## 2019-01-17 DIAGNOSIS — K219 Gastro-esophageal reflux disease without esophagitis: Secondary | ICD-10-CM

## 2019-01-17 DIAGNOSIS — J3089 Other allergic rhinitis: Secondary | ICD-10-CM

## 2019-01-17 DIAGNOSIS — E663 Overweight: Secondary | ICD-10-CM

## 2019-01-17 DIAGNOSIS — F419 Anxiety disorder, unspecified: Secondary | ICD-10-CM | POA: Diagnosis not present

## 2019-01-17 DIAGNOSIS — J302 Other seasonal allergic rhinitis: Secondary | ICD-10-CM

## 2019-01-17 DIAGNOSIS — E785 Hyperlipidemia, unspecified: Secondary | ICD-10-CM

## 2019-01-17 LAB — BASIC METABOLIC PANEL WITH GFR
BUN: 13 mg/dL (ref 7–25)
CALCIUM: 9.5 mg/dL (ref 8.6–10.4)
CHLORIDE: 102 mmol/L (ref 98–110)
CO2: 30 mmol/L (ref 20–32)
Creat: 1.01 mg/dL (ref 0.50–1.05)
GFR, EST AFRICAN AMERICAN: 75 mL/min/{1.73_m2} (ref >=60)
GFR, Est Non African American: 64 mL/min/{1.73_m2} (ref >=60)
Glucose, Bld: 96 mg/dL (ref 65–99)
POTASSIUM: 4.1 mmol/L (ref 3.5–5.3)
Sodium: 140 mmol/L (ref 135–146)

## 2019-01-17 LAB — LIPID PANEL
CHOL/HDL RATIO: 3 (calc) (ref <5.0)
Cholesterol: 228 mg/dL — ABNORMAL HIGH (ref <200)
HDL: 76 mg/dL (ref >=50)
LDL Cholesterol (Calc): 133 mg/dL (calc) — ABNORMAL HIGH
NON-HDL CHOLESTEROL (CALC): 152 mg/dL — AB (ref <130)
Triglycerides: 88 mg/dL (ref <150)

## 2019-01-17 MED ORDER — CHLORTHALIDONE 25 MG PO TABS: 12.5000 mg | ORAL_TABLET | Freq: Every day | ORAL | 1 refills | Status: DC

## 2019-01-17 MED ORDER — AZELASTINE-FLUTICASONE 137-50 MCG/ACT NA SUSP: 2.0000 | Freq: Every day | NASAL | 5 refills | Status: DC

## 2019-01-17 MED ORDER — PANTOPRAZOLE SODIUM 40 MG PO TBEC: DELAYED_RELEASE_TABLET | ORAL | 1 refills | Status: DC

## 2019-01-17 MED ORDER — LOVASTATIN 40 MG PO TABS: 40.0000 mg | ORAL_TABLET | Freq: Every day | ORAL | 0 refills | Status: DC

## 2019-01-17 MED ORDER — FEXOFENADINE HCL 180 MG PO TABS: 180.0000 mg | ORAL_TABLET | Freq: Every day | ORAL | 1 refills | Status: DC

## 2019-01-17 NOTE — Assessment & Plan Note (Signed)
Discussed importance of 150 minutes of physical activity weekly, eat two servings of fish weekly, eat one serving of tree nuts ( cashews, pistachios, pecans, almonds..) every other day, eat 6 servings of fruit/vegetables daily and drink plenty of water and avoid sweet beverages. 

## 2019-01-17 NOTE — Assessment & Plan Note (Signed)
Avoid triggers. Protonix PRN

## 2019-01-17 NOTE — Assessment & Plan Note (Signed)
Seeing Psychiatry next month.

## 2019-01-17 NOTE — Assessment & Plan Note (Signed)
Seeing Psychiatry next month. 

## 2019-01-17 NOTE — Progress Notes (Signed)
Established Patient Office Visit  Subjective:  Patient ID: Cassidy Collins, female    DOB: 1966-01-13  Age: 53 y.o. MRN: [id number]  CC:  Chief Complaint  Patient presents with  . Hypertension    follow up  . Depression    HPI Cassidy Collins presents for follow up:  Anxiety and Depression: Was seen in November for FMLA paperwork due to increased stress at work.  She was referred to psychiatry at that time - she does have an upcoming appointment.  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  PHQ9 score today is 9 which is an improvement from last visit.  - At last visit we stopped hydroxyzine and changed to buspar, also increased lexapro to 20mg  daily. - Denies SI/HI, She denies panic attacks    [facility]  PHQ-9 Total Score  9     GAD 7 : Generalized Anxiety Score 01/17/2019 10/04/2018  Nervous, Anxious, on Edge 1 2  Control/stop worrying 2 1  Worry too much - different things 2 3  Trouble relaxing 0 2  Restless 2 1  Easily annoyed or irritable 2 3  Afraid - awful might happen 0 3  Total GAD 7 Score 9 15  Anxiety Difficulty Very difficult Somewhat difficult   HTN:  She was started on chlorthalidone 12.5mg  (1/2 tablet) daily and she is feeling well on this.  She is under a lot of stress right now - see above.  BP at goal today.  She has been working out regularly, but missed last week due to illness.  -no TIAs, no chest pain on exertion, no dyspnea on exertion, no swelling of ankles, no orthostatic dizziness or lightheadedness, no palpitations - DASH diet discussed - trying to follow a low sodium diet now. - The followingarecontributing factors: stress; trying to be more active. - PT has been checking BP at home and they have been running 120's/70-80's. - Stable and unchanged.  Overweight: Heaviest weight 182lbs.  She stopped alcohol, sweets, and is drinking significantly less sweet beverages, and she is exercising more often.  She is trying to eat more fresh fruits and vegetables.  She is down 3lbs since last visit. Continuing to improve - down a total of 18lbs over the last 2 years.  GERD: - Current medication regimen:was Rx'd omeprazole, but it was not approved; we wrote for pantoprazole instead, and this has been working well - taking only PRN (~3 doses/week).  She is decreasing her portions and exercising more regularly.  IF she does eat a trigger food, she eats small amount.  - Triggers:no or minimal alcohol(she stopped drinking alcohol 2 weeks ago; was drinking on the weekends only), no or mild caffeine use, using NSAID's regularly(excedrine)alcohol, caffeine, lying down after eating and tomato sauce - Denies:abdominal pain, choking/dysphagia, chest discomfort, cough, unintentional weight loss, black stools, diarrhea, constipation or history of GI bleeding.  HLD: Due for labs today, taking lovastatin daily without issues. Denies chest pain, shortness of breath, or myalgias.  AR: Taking allegra at night, using dymista PRN. Doing well on this.  Worse during seasonal changes.  Health Maintenance: Had CPE with OB/GYN with normal pap 10/16/2018. She is due for labs today.   Past Medical History:  Diagnosis Date  . Anxiety 2019  . Family history of colon cancer   .  Family history of ovarian cancer   . GERD (gastroesophageal reflux disease)   . History of abnormal cervical Pap smear    positive HRHPV  . History of mammogram 10/04/2016   neg  . Hyperlipidemia     Past Surgical History:  Procedure Laterality Date  . COLONOSCOPY  2010   Dr Vanetta Shawl  . COLONOSCOPY W/ BIOPSIES  03/02/2018   hyperplastic polyp-repeat in 5 years/ Dr Norma Fredrickson  . DILATION AND CURETTAGE OF UTERUS    . 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 TUBAL LIGATION      Family History  Problem Relation Age of  Onset  . Colon cancer Father 68  . Hypertension Father   . Breast cancer Maternal Aunt 67  . Ovarian cancer Maternal Grandmother 75  . Hypertension Mother   . Hypertension Sister     Social History   Socioeconomic History  . Marital status: Married    Spouse name: Not on file  . Number of children: 1  . Years of education: Not on file  . Highest education level: Not on file  Occupational History  . Occupation: Neurosurgeon  Social Needs  . Financial resource strain: Not on file  . Food insecurity:    Worry: Not on file    Inability: Not on file  . Transportation needs:    Medical: Not on file    Non-medical: Not on file  Tobacco Use  . Smoking status: Never Smoker  . Smokeless tobacco: Never Used  Substance and Sexual Activity  . Alcohol use: Yes    Alcohol/week: 0.0 standard drinks    Comment: occasionally on weekend  . Drug use: No  . Sexual activity: Yes    Partners: Male    Birth control/protection: Post-menopausal  Lifestyle  . Physical activity:    Days per week: Not on file    Minutes per session: Not on file  . Stress: Not on file  Relationships  . Social connections:    Talks on phone: Not on file    Gets together: Not on file    Attends religious service: Not on file    Active member of club or organization: Not on file    Attends meetings of clubs or organizations: Not on file    Relationship status: Not on file  . Intimate partner violence:    Fear of current or ex partner: Not on file    Emotionally abused: Not on file    Physically abused: Not on file    Forced sexual activity: Not on file  Other Topics Concern  . Not on file  Social History Narrative  . Not on file    Outpatient Medications Prior to Visit  Medication Sig Dispense Refill  . busPIRone (BUSPAR) 7.5 MG tablet Take 1 tablet at night x7 days; then take twice daily x7 days; then may take 3rd dose as needed during the day 90 tablet 0  . escitalopram (LEXAPRO) 20 MG  tablet Take 1 tablet (20 mg total) by mouth daily. 30 tablet 1  . Azelastine-Fluticasone 137-50 MCG/ACT SUSP Place 2 sprays into the nose daily. 23 g 3  . chlorthalidone (HYGROTON) 25 MG tablet TAKE ONE-HALF TABLET BY MOUTH ONCE DAILY 30 tablet 0  . fexofenadine (ALLEGRA) 180 MG tablet Take 1 tablet (180 mg total) by mouth daily. 90 tablet 1  . lovastatin (MEVACOR) 40 MG tablet TAKE 1 TABLET BY MOUTH AT BEDTIME 90 tablet 0  . pantoprazole (PROTONIX)  40 MG tablet TAKE 1 TABLET BY MOUTH TWICE DAILY BEFORE A MEAL 60 tablet 0   No facility-administered medications prior to visit.     Allergies  Allergen Reactions  . Bee Venom     ROS Review of Systems Constitutional: Negative for fever or weight change.  Respiratory: Negative for cough and shortness of breath.   Cardiovascular: Negative for chest pain or palpitations.  Gastrointestinal: Negative for abdominal pain, no bowel changes.  Musculoskeletal: Negative for gait problem or joint swelling.  Skin: Negative for rash.  Neurological: Negative for dizziness or headache.  No other specific complaints in a complete review of systems (except as listed in HPI above).    Objective:    Physical Exam  Constitutional: Patient appears well-developed and well-nourished. No distress.  HENT: Head: Normocephalic and atraumatic. Ears: bilateral TMs with no erythema or effusion; Nose: Nose normal. Mouth/Throat: Oropharynx is clear and moist. No oropharyngeal exudate or tonsillar swelling.  Eyes: Conjunctivae and EOM are normal. No scleral icterus.  Pupils are equal, round, and reactive to light.  Neck: Normal range of motion. Neck supple. No JVD present. No thyromegaly present.  Cardiovascular: Normal rate, regular rhythm and normal heart sounds.  No murmur heard. No BLE edema. Pulmonary/Chest: Effort normal and breath sounds normal. No respiratory distress. Musculoskeletal: Normal range of motion, no joint effusions. No gross  deformities Neurological: Pt is alert and oriented to person, place, and time. No cranial nerve deficit. Coordination, balance, strength, speech and gait are normal.  Skin: Skin is warm and dry. No rash noted. No erythema.  Psychiatric: Patient has a normal mood and affect. behavior is normal. Judgment and thought content normal.  BP 124/82 (BP Location: Right Arm, Patient Position: Sitting, Cuff Size: Large)   Pulse 82   Temp 97.8 F (36.6 C) (Oral)   Resp 16   Ht 5\' 5"  (1.651 m)   Wt 164 lb 11.2 oz (74.7 kg)   SpO2 97%   BMI 27.41 kg/m  Wt Readings from Last 3 Encounters:  01/17/19 164 lb 11.2 oz (74.7 kg)  10/16/18 167 lb (75.8 kg)  10/04/18 168 lb (76.2 kg)   There are no preventive care reminders to display for this patient.  There are no preventive care reminders to display for this patient.  Lab Results  Component Value Date   TSH 1.95 06/26/2018   Lab Results  Component Value Date   WBC 6.4 06/26/2018   HGB 15.2 06/26/2018   HCT 45.0 06/26/2018   MCV 87.9 06/26/2018   PLT 225 06/26/2018   Lab Results  Component Value Date   NA 139 07/17/2018   K 3.6 07/17/2018   CO2 27 07/17/2018   GLUCOSE 98 07/17/2018   BUN 15 07/17/2018   CREATININE 1.13 (H) 07/17/2018   BILITOT 0.6 06/26/2018   ALKPHOS 113 09/01/2017   AST 20 06/26/2018   ALT 18 06/26/2018   PROT 7.3 06/26/2018   ALBUMIN 4.6 09/01/2017   CALCIUM 9.9 07/17/2018   Lab Results  Component Value Date   CHOL 215 (H) 06/26/2018   Lab Results  Component Value Date   HDL 75 06/26/2018   Lab Results  Component Value Date   LDLCALC 115 (H) 06/26/2018   Lab Results  Component Value Date   TRIG 141 06/26/2018   Lab Results  Component Value Date   CHOLHDL 2.9 06/26/2018   No results found for: HGBA1C    Assessment & Plan:   Problem List Items Addressed This Visit  Cardiovascular and Mediastinum   Essential hypertension    Stable, continue chlorthalidone      Relevant Medications    chlorthalidone (HYGROTON) 25 MG tablet   lovastatin (MEVACOR) 40 MG tablet   Other Relevant Orders   BASIC METABOLIC PANEL WITH GFR     Respiratory   Other allergic rhinitis    Continue medications PRN        Digestive   Gastroesophageal reflux disease without esophagitis    Avoid triggers. Protonix PRN      Relevant Medications   pantoprazole (PROTONIX) 40 MG tablet     Other   Hyperlipidemia    Stable, labs today.      Relevant Medications   chlorthalidone (HYGROTON) 25 MG tablet   lovastatin (MEVACOR) 40 MG tablet   Other Relevant Orders   Lipid panel   Overweight (BMI 25.0-29.9)    Discussed importance of 150 minutes of physical activity weekly, eat two servings of fish weekly, eat one serving of tree nuts ( cashews, pistachios, pecans, almonds.Marland Kitchen) every other day, eat 6 servings of fruit/vegetables daily and drink plenty of water and avoid sweet beverages.       Relevant Orders   BASIC METABOLIC PANEL WITH GFR   Current moderate episode of major depressive disorder without prior episode (HCC) - Primary    Seeing Psychiatry next month.      Relevant Orders   Lipid panel   BASIC METABOLIC PANEL WITH GFR   Anxiety    Seeing Psychiatry next month.      Relevant Orders   Lipid panel   BASIC METABOLIC PANEL WITH GFR    Other Visit Diagnoses    Seasonal allergic rhinitis, unspecified trigger       Relevant Medications   fexofenadine (ALLEGRA) 180 MG tablet   Azelastine-Fluticasone 137-50 MCG/ACT SUSP      Meds ordered this encounter  Medications  . pantoprazole (PROTONIX) 40 MG tablet    Sig: Take once daily as needed for heartburn/reflux symptoms.    Dispense:  90 tablet    Refill:  1    Order Specific Question:   Supervising Provider    Answer:   Alba Cory [[id number]]  . chlorthalidone (HYGROTON) 25 MG tablet    Sig: Take 0.5 tablets (12.5 mg total) by mouth daily.    Dispense:  90 tablet    Refill:  1    Order Specific Question:   Supervising  Provider    Answer:   Alba Cory [[id number]]  . fexofenadine (ALLEGRA) 180 MG tablet    Sig: Take 1 tablet (180 mg total) by mouth daily.    Dispense:  90 tablet    Refill:  1    Order Specific Question:   Supervising Provider    Answer:   Alba Cory [[id number]]  . Azelastine-Fluticasone 137-50 MCG/ACT SUSP    Sig: Place 2 sprays into the nose daily.    Dispense:  23 g    Refill:  5    Order Specific Question:   Supervising Provider    Answer:   Alba Cory [[id number]]  . lovastatin (MEVACOR) 40 MG tablet    Sig: Take 1 tablet (40 mg total) by mouth at bedtime.    Dispense:  90 tablet    Refill:  0    Order Specific Question:   Supervising Provider    Answer:   Alba Cory [[id number]]    Follow-up: Return in about 6 months (around 07/18/2019).    Doren Custard, FNP

## 2019-01-17 NOTE — Assessment & Plan Note (Signed)
Stable, labs today 

## 2019-01-17 NOTE — Assessment & Plan Note (Signed)
Continue medications PRN

## 2019-01-17 NOTE — Assessment & Plan Note (Signed)
Stable, continue chlorthalidone

## 2019-01-17 NOTE — Patient Instructions (Signed)
DASH Eating Plan DASH stands for "Dietary Approaches to Stop Hypertension." The DASH eating plan is a healthy eating plan that has been shown to reduce high blood pressure (hypertension). It may also reduce your risk for type 2 diabetes, heart disease, and stroke. The DASH eating plan may also help with weight loss. What are tips for following this plan?  General guidelines  Avoid eating more than 2,300 mg (milligrams) of salt (sodium) a day. If you have hypertension, you may need to reduce your sodium intake to 1,500 mg a day.  Limit alcohol intake to no more than 1 drink a day for nonpregnant women and 2 drinks a day for men. One drink equals 12 oz of beer, 5 oz of wine, or 1 oz of hard liquor.  Work with your health care provider to maintain a healthy body weight or to lose weight. Ask what an ideal weight is for you.  Get at least 30 minutes of exercise that causes your heart to beat faster (aerobic exercise) most days of the week. Activities may include walking, swimming, or biking.  Work with your health care provider or diet and nutrition specialist (dietitian) to adjust your eating plan to your individual calorie needs. Reading food labels   Check food labels for the amount of sodium per serving. Choose foods with less than 5 percent of the Daily Value of sodium. Generally, foods with less than 300 mg of sodium per serving fit into this eating plan.  To find whole grains, look for the word "whole" as the first word in the ingredient list. Shopping  Buy products labeled as "low-sodium" or "no salt added."  Buy fresh foods. Avoid canned foods and premade or frozen meals. Cooking  Avoid adding salt when cooking. Use salt-free seasonings or herbs instead of table salt or sea salt. Check with your health care provider or pharmacist before using salt substitutes.  Do not fry foods. Cook foods using healthy methods such as baking, boiling, grilling, and broiling instead.  Cook with  heart-healthy oils, such as olive, canola, soybean, or sunflower oil. Meal planning  Eat a balanced diet that includes: ? 5 or more servings of fruits and vegetables each day. At each meal, try to fill half of your plate with fruits and vegetables. ? Up to 6-8 servings of whole grains each day. ? Less than 6 oz of lean meat, poultry, or fish each day. A 3-oz serving of meat is about the same size as a deck of cards. One egg equals 1 oz. ? 2 servings of low-fat dairy each day. ? A serving of nuts, seeds, or beans 5 times each week. ? Heart-healthy fats. Healthy fats called Omega-3 fatty acids are found in foods such as flaxseeds and coldwater fish, like sardines, salmon, and mackerel.  Limit how much you eat of the following: ? Canned or prepackaged foods. ? Food that is high in trans fat, such as fried foods. ? Food that is high in saturated fat, such as fatty meat. ? Sweets, desserts, sugary drinks, and other foods with added sugar. ? Full-fat dairy products.  Do not salt foods before eating.  Try to eat at least 2 vegetarian meals each week.  Eat more home-cooked food and less restaurant, buffet, and fast food.  When eating at a restaurant, ask that your food be prepared with less salt or no salt, if possible. What foods are recommended? The items listed may not be a complete list. Talk with your dietitian about   what dietary choices are best for you. Grains Whole-grain or whole-wheat bread. Whole-grain or whole-wheat pasta. Brown rice. Oatmeal. Quinoa. Bulgur. Whole-grain and low-sodium cereals. Pita bread. Low-fat, low-sodium crackers. Whole-wheat flour tortillas. Vegetables Fresh or frozen vegetables (raw, steamed, roasted, or grilled). Low-sodium or reduced-sodium tomato and vegetable juice. Low-sodium or reduced-sodium tomato sauce and tomato paste. Low-sodium or reduced-sodium canned vegetables. Fruits All fresh, dried, or frozen fruit. Canned fruit in natural juice (without  added sugar). Meat and other protein foods Skinless chicken or turkey. Ground chicken or turkey. Pork with fat trimmed off. Fish and seafood. Egg whites. Dried beans, peas, or lentils. Unsalted nuts, nut butters, and seeds. Unsalted canned beans. Lean cuts of beef with fat trimmed off. Low-sodium, lean deli meat. Dairy Low-fat (1%) or fat-free (skim) milk. Fat-free, low-fat, or reduced-fat cheeses. Nonfat, low-sodium ricotta or cottage cheese. Low-fat or nonfat yogurt. Low-fat, low-sodium cheese. Fats and oils Soft margarine without trans fats. Vegetable oil. Low-fat, reduced-fat, or light mayonnaise and salad dressings (reduced-sodium). Canola, safflower, olive, soybean, and sunflower oils. Avocado. Seasoning and other foods Herbs. Spices. Seasoning mixes without salt. Unsalted popcorn and pretzels. Fat-free sweets. What foods are not recommended? The items listed may not be a complete list. Talk with your dietitian about what dietary choices are best for you. Grains Baked goods made with fat, such as croissants, muffins, or some breads. Dry pasta or rice meal packs. Vegetables Creamed or fried vegetables. Vegetables in a cheese sauce. Regular canned vegetables (not low-sodium or reduced-sodium). Regular canned tomato sauce and paste (not low-sodium or reduced-sodium). Regular tomato and vegetable juice (not low-sodium or reduced-sodium). Pickles. Olives. Fruits Canned fruit in a light or heavy syrup. Fried fruit. Fruit in cream or butter sauce. Meat and other protein foods Fatty cuts of meat. Ribs. Fried meat. Bacon. Sausage. Bologna and other processed lunch meats. Salami. Fatback. Hotdogs. Bratwurst. Salted nuts and seeds. Canned beans with added salt. Canned or smoked fish. Whole eggs or egg yolks. Chicken or turkey with skin. Dairy Whole or 2% milk, cream, and half-and-half. Whole or full-fat cream cheese. Whole-fat or sweetened yogurt. Full-fat cheese. Nondairy creamers. Whipped toppings.  Processed cheese and cheese spreads. Fats and oils Butter. Stick margarine. Lard. Shortening. Ghee. Bacon fat. Tropical oils, such as coconut, palm kernel, or palm oil. Seasoning and other foods Salted popcorn and pretzels. Onion salt, garlic salt, seasoned salt, table salt, and sea salt. Worcestershire sauce. Tartar sauce. Barbecue sauce. Teriyaki sauce. Soy sauce, including reduced-sodium. Steak sauce. Canned and packaged gravies. Fish sauce. Oyster sauce. Cocktail sauce. Horseradish that you find on the shelf. Ketchup. Mustard. Meat flavorings and tenderizers. Bouillon cubes. Hot sauce and Tabasco sauce. Premade or packaged marinades. Premade or packaged taco seasonings. Relishes. Regular salad dressings. Where to find more information:  National Heart, Lung, and Blood Institute: www.nhlbi.nih.gov  American Heart Association: www.heart.org Summary  The DASH eating plan is a healthy eating plan that has been shown to reduce high blood pressure (hypertension). It may also reduce your risk for type 2 diabetes, heart disease, and stroke.  With the DASH eating plan, you should limit salt (sodium) intake to 2,300 mg a day. If you have hypertension, you may need to reduce your sodium intake to 1,500 mg a day.  When on the DASH eating plan, aim to eat more fresh fruits and vegetables, whole grains, lean proteins, low-fat dairy, and heart-healthy fats.  Work with your health care provider or diet and nutrition specialist (dietitian) to adjust your eating plan to your   individual calorie needs. This information is not intended to replace advice given to you by your health care provider. Make sure you discuss any questions you have with your health care provider. Document Released: 11/03/2011 Document Revised: 11/07/2016 Document Reviewed: 11/07/2016 Elsevier Interactive Patient Education  2019 [employer] Fat and Cholesterol Restricted Eating Plan Getting too much fat and cholesterol in your diet  may cause health problems. Choosing the right foods helps keep your fat and cholesterol at normal levels. This can keep you from getting certain diseases. Your doctor may recommend an eating plan that includes:  Total fat: ______% or less of total calories a day.  Saturated fat: ______% or less of total calories a day.  Cholesterol: less than _________mg a day.  Fiber: ______g a day. What are tips for following this plan? Meal planning  At meals, divide your plate into four equal parts: ? Fill one-half of your plate with vegetables and green salads. ? Fill one-fourth of your plate with whole grains. ? Fill one-fourth of your plate with low-fat (lean) protein foods.  Eat fish that is high in omega-3 fats at least two times a week. This includes mackerel, tuna, sardines, and salmon.  Eat foods that are high in fiber, such as whole grains, beans, apples, broccoli, carrots, peas, and barley. General tips   Work with your doctor to lose weight if you need to.  Avoid: ? Foods with added sugar. ? Fried foods. ? Foods with partially hydrogenated oils.  Limit alcohol intake to no more than 1 drink a day for nonpregnant women and 2 drinks a day for men. One drink equals 12 oz of beer, 5 oz of wine, or 1 oz of hard liquor. Reading food labels  Check food labels for: ? Trans fats. ? Partially hydrogenated oils. ? Saturated fat (g) in each serving. ? Cholesterol (mg) in each serving. ? Fiber (g) in each serving.  Choose foods with healthy fats, such as: ? Monounsaturated fats. ? Polyunsaturated fats. ? Omega-3 fats.  Choose grain products that have whole grains. Look for the word "whole" as the first word in the ingredient list. Cooking  Cook foods using low-fat methods. These include baking, boiling, grilling, and broiling.  Eat more home-cooked foods. Eat at restaurants and buffets less often.  Avoid cooking using saturated fats, such as butter, cream, palm oil, palm kernel  oil, and coconut oil. Recommended foods  Fruits  All fresh, canned (in natural juice), or frozen fruits. Vegetables  Fresh or frozen vegetables (raw, steamed, roasted, or grilled). Green salads. Grains  Whole grains, such as whole wheat or whole grain breads, crackers, cereals, and pasta. Unsweetened oatmeal, bulgur, barley, quinoa, or brown rice. Corn or whole wheat flour tortillas. Meats and other protein foods  Ground beef (85% or leaner), grass-fed beef, or beef trimmed of fat. Skinless chicken or turkey. Ground chicken or turkey. Pork trimmed of fat. All fish and seafood. Egg whites. Dried beans, peas, or lentils. Unsalted nuts or seeds. Unsalted canned beans. Nut butters without added sugar or oil. Dairy  Low-fat or nonfat dairy products, such as skim or 1% milk, 2% or reduced-fat cheeses, low-fat and fat-free ricotta or cottage cheese, or plain low-fat and nonfat yogurt. Fats and oils  Tub margarine without trans fats. Light or reduced-fat mayonnaise and salad dressings. Avocado. Olive, canola, sesame, or safflower oils. The items listed above may not be a complete list of foods and beverages you can eat. Contact a dietitian for more information. Foods   to avoid Fruits  Canned fruit in heavy syrup. Fruit in cream or butter sauce. Fried fruit. Vegetables  Vegetables cooked in cheese, cream, or butter sauce. Fried vegetables. Grains  White bread. White pasta. White rice. Cornbread. Bagels, pastries, and croissants. Crackers and snack foods that contain trans fat and hydrogenated oils. Meats and other protein foods  Fatty cuts of meat. Ribs, chicken wings, bacon, sausage, bologna, salami, chitterlings, fatback, hot dogs, bratwurst, and packaged lunch meats. Liver and organ meats. Whole eggs and egg yolks. Chicken and turkey with skin. Fried meat. Dairy  Whole or 2% milk, cream, half-and-half, and cream cheese. Whole milk cheeses. Whole-fat or sweetened yogurt. Full-fat  cheeses. Nondairy creamers and whipped toppings. Processed cheese, cheese spreads, and cheese curds. Beverages  Alcohol. Sugar-sweetened drinks such as sodas, lemonade, and fruit drinks. Fats and oils  Butter, stick margarine, lard, shortening, ghee, or bacon fat. Coconut, palm kernel, and palm oils. Sweets and desserts  Corn syrup, sugars, honey, and molasses. Candy. Jam and jelly. Syrup. Sweetened cereals. Cookies, pies, cakes, donuts, muffins, and ice cream. The items listed above may not be a complete list of foods and beverages you should avoid. Contact a dietitian for more information. Summary  Choosing the right foods helps keep your fat and cholesterol at normal levels. This can keep you from getting certain diseases.  At meals, fill one-half of your plate with vegetables and green salads.  Eat high-fiber foods, like whole grains, beans, apples, carrots, peas, and barley.  Limit added sugar, saturated fats, alcohol, and fried foods. This information is not intended to replace advice given to you by your health care provider. Make sure you discuss any questions you have with your health care provider. Document Released: 05/15/2012 Document Revised: 07/18/2018 Document Reviewed: 08/01/2017 Elsevier Interactive Patient Education  2019 [employer]  

## 2019-01-18 ENCOUNTER — Other Ambulatory Visit: Payer: Self-pay | Admitting: Family Medicine

## 2019-01-18 DIAGNOSIS — E782 Mixed hyperlipidemia: Secondary | ICD-10-CM

## 2019-01-18 MED ORDER — ATORVASTATIN CALCIUM 40 MG PO TABS: 40.0000 mg | ORAL_TABLET | Freq: Every day | ORAL | 3 refills | Status: DC

## 2019-03-13 ENCOUNTER — Other Ambulatory Visit: Payer: Self-pay | Admitting: Family Medicine

## 2019-03-13 ENCOUNTER — Telehealth: Payer: Self-pay | Admitting: Family Medicine

## 2019-03-13 DIAGNOSIS — I10 Essential (primary) hypertension: Secondary | ICD-10-CM

## 2019-03-13 MED ORDER — CHLORTHALIDONE 25 MG PO TABS: 12.5000 mg | ORAL_TABLET | Freq: Every day | ORAL | 1 refills | Status: DC

## 2019-03-13 NOTE — Telephone Encounter (Signed)
Pt called and wanted to know if she needs to continue to take her blood pressure medication , she said that she is out of her medication and needs a rx sent in the her pharmacy . Please advise.

## 2019-03-13 NOTE — Telephone Encounter (Signed)
Patient need to continue BP medication. Order sent to [facility] to refill. Patient notified

## 2019-03-13 NOTE — Telephone Encounter (Signed)
Order signed.

## 2019-03-14 ENCOUNTER — Telehealth: Payer: Self-pay | Admitting: Family Medicine

## 2019-03-14 NOTE — Telephone Encounter (Signed)
Received PA denial for pantoprazole refill from insurance. Please call insurance and inquire as to why it was initially covered and is now not being covered.  Please submit appeal if appropriate.

## 2019-03-25 ENCOUNTER — Other Ambulatory Visit: Payer: Self-pay | Admitting: Emergency Medicine

## 2019-03-25 ENCOUNTER — Telehealth: Payer: Self-pay | Admitting: Family Medicine

## 2019-03-25 DIAGNOSIS — F419 Anxiety disorder, unspecified: Secondary | ICD-10-CM

## 2019-03-25 DIAGNOSIS — I10 Essential (primary) hypertension: Secondary | ICD-10-CM

## 2019-03-25 DIAGNOSIS — F321 Major depressive disorder, single episode, moderate: Secondary | ICD-10-CM

## 2019-03-25 NOTE — Telephone Encounter (Signed)
Will pay for Esomeprazole, but sending form in for Protonix

## 2019-03-25 NOTE — Telephone Encounter (Signed)
Error

## 2019-07-16 ENCOUNTER — Other Ambulatory Visit: Payer: Self-pay | Admitting: Family Medicine

## 2019-07-16 DIAGNOSIS — E785 Hyperlipidemia, unspecified: Secondary | ICD-10-CM

## 2019-07-18 ENCOUNTER — Ambulatory Visit: Payer: Self-pay | Admitting: Family Medicine

## 2019-07-31 ENCOUNTER — Ambulatory Visit: Payer: Commercial Managed Care - PPO | Admitting: Family Medicine

## 2019-08-07 ENCOUNTER — Ambulatory Visit: Payer: Commercial Managed Care - PPO | Admitting: Family Medicine

## 2019-08-08 ENCOUNTER — Ambulatory Visit: Payer: Commercial Managed Care - PPO | Admitting: Family Medicine

## 2019-08-08 ENCOUNTER — Other Ambulatory Visit: Payer: Self-pay

## 2019-08-08 ENCOUNTER — Encounter: Payer: Self-pay | Admitting: Family Medicine

## 2019-08-08 VITALS — BP 102/64 | HR 96 | Temp 97.9°F | Resp 16 | Ht 65.0 in | Wt 167.2 lb

## 2019-08-08 DIAGNOSIS — K219 Gastro-esophageal reflux disease without esophagitis: Secondary | ICD-10-CM

## 2019-08-08 DIAGNOSIS — F321 Major depressive disorder, single episode, moderate: Secondary | ICD-10-CM | POA: Diagnosis not present

## 2019-08-08 DIAGNOSIS — Z23 Encounter for immunization: Secondary | ICD-10-CM

## 2019-08-08 DIAGNOSIS — J3089 Other allergic rhinitis: Secondary | ICD-10-CM

## 2019-08-08 DIAGNOSIS — I10 Essential (primary) hypertension: Secondary | ICD-10-CM | POA: Diagnosis not present

## 2019-08-08 DIAGNOSIS — E663 Overweight: Secondary | ICD-10-CM | POA: Diagnosis not present

## 2019-08-08 DIAGNOSIS — J302 Other seasonal allergic rhinitis: Secondary | ICD-10-CM

## 2019-08-08 DIAGNOSIS — E785 Hyperlipidemia, unspecified: Secondary | ICD-10-CM

## 2019-08-08 DIAGNOSIS — F419 Anxiety disorder, unspecified: Secondary | ICD-10-CM

## 2019-08-08 MED ORDER — AZELASTINE-FLUTICASONE 137-50 MCG/ACT NA SUSP: 2.0000 | Freq: Every day | NASAL | 5 refills | Status: DC

## 2019-08-08 MED ORDER — FEXOFENADINE HCL 180 MG PO TABS: 180.0000 mg | ORAL_TABLET | Freq: Every day | ORAL | 1 refills | Status: DC

## 2019-08-08 MED ORDER — BUSPIRONE HCL 7.5 MG PO TABS: ORAL_TABLET | ORAL | 2 refills | Status: DC

## 2019-08-08 MED ORDER — ESCITALOPRAM OXALATE 20 MG PO TABS: 20.0000 mg | ORAL_TABLET | Freq: Every day | ORAL | 2 refills | Status: DC

## 2019-08-08 MED ORDER — CHLORTHALIDONE 25 MG PO TABS: 12.5000 mg | ORAL_TABLET | Freq: Every day | ORAL | 2 refills | Status: DC

## 2019-08-08 MED ORDER — PANTOPRAZOLE SODIUM 40 MG PO TBEC: DELAYED_RELEASE_TABLET | ORAL | 1 refills | Status: DC

## 2019-08-08 NOTE — Patient Instructions (Signed)
Lexapro: Take 1 tablet once daily EVERY DAY. Buspar: Take 1 tablet up to twice daily as needed for anxiety.

## 2019-08-08 NOTE — Progress Notes (Signed)
Name: Cassidy Collins   MRN: [id number]    DOB: Jun 16, 1966   Date:08/08/2019       Progress Note  Subjective  Chief Complaint  Chief Complaint  Patient presents with  . Hypertension    6 month follow ups  . Hyperlipidemia  . Depression  . Gastroesophageal Reflux    HPI  Anxiety and Depression:  She was referred to psychiatry back in November 2019, but never went. 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  PHQ9 score today is7 which is an improvement from last visit. She is only taking her Lexapro PRN - advised must take daily to actually be effective.  She is on 20mg  Lexapro; has been taking buspar very PRN.  Reviewed medications today to increase compliance. - Denies SI/HI, She denies panic attacks   [facility]  PHQ-9 Total Score  7     HTN: She was started on chlorthalidone 12.5mg  (1/2 tablet) daily and she is feeling well on this. Low end of normal today, but just came from the gym. -no TIAs, no chest pain on exertion, no dyspnea on exertion, no swelling of ankles, no orthostatic dizziness or lightheadedness, no palpitations, no blurred vision. - DASH diet discussed -trying to follow a low sodium diet now.   Overweight: Heaviest weight 182lbs; she is now 167lbs today. She stopped alcohol, sweets, and is drinking significantly less sweet beverages, and she is exercising more often. She is trying to eat more fresh fruits and vegetables. Stable and unchanged.  GERD: - Current medication regimen:was Rx'd omeprazole, but it was not approved; we wrote for pantoprazole instead, and this has been working well - taking only PRN (~3 doses/week).  She is decreasing her portions and exercising more regularly.  If she does eat a trigger food, she eats small amount.  - Triggers:no or minimal alcohol(she stopped drinking alcohol 2  weeks ago; was drinking on the weekends only), no or mild caffeine use, using NSAID's regularly(excedrine)alcohol, caffeine, lying down after eating and tomato sauce - Denies:abdominal pain,choking/dysphagia, chest discomfort,cough, unintentional weight loss, black stools, diarrhea, constipation or history of GI bleeding. - Stable from last visit.  HLD: Due for labs today, taking atorvastatin daily without issues. Denies chest pain, shortness of breath, or myalgias.   AR: Taking allegra at night, using dymista PRN. Doing well on this.  Worse during seasonal changes - having a slight flare right now due to the weather changes.  Health Maintenance: Had CPE with OB/GYN with normal pap 10/16/2018. She is due for labs today.  Patient Active Problem List   Diagnosis Date Noted  . Current moderate episode of major depressive disorder without prior episode (HCC) 01/17/2019  . Anxiety 01/17/2019  . Essential hypertension 01/17/2019  . Family history of colon cancer   . Family history of ovarian cancer   . Hyperlipidemia 09/01/2017  . Overweight (BMI 25.0-29.9) 09/01/2017  . Other allergic rhinitis 09/01/2017  . Gastroesophageal reflux disease without esophagitis 09/01/2017    Past Surgical History:  Procedure Laterality Date  . COLONOSCOPY  2010   Dr Vanetta Shawlh-WNL  . COLONOSCOPY W/ BIOPSIES  03/02/2018   hyperplastic polyp-repeat in 5 years/ Dr Norma Fredricksonoledo  . DILATION AND CURETTAGE OF UTERUS    . 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 TUBAL LIGATION  Family History  Problem Relation Age of Onset  . Colon cancer Father 3155  . Hypertension Father   . Breast cancer Maternal Aunt [id number]  . Ovarian cancer Maternal Grandmother 75  . Hypertension Mother   . Hypertension Sister     Social History   Socioeconomic History  . Marital status: Married    Spouse name: Not on file  . Number of children: 1  . Years of education: Not on file  . Highest education level: Not on file   Occupational History  . Occupation: NeurosurgeonClaims Coder Specialist  Social Needs  . Financial resource strain: Not on file  . Food insecurity    Worry: Not on file    Inability: Not on file  . Transportation needs    Medical: Not on file    Non-medical: Not on file  Tobacco Use  . Smoking status: Never Smoker  . Smokeless tobacco: Never Used  Substance and Sexual Activity  . Alcohol use: Yes    Alcohol/week: 0.0 standard drinks    Comment: occasionally on weekend  . Drug use: No  . Sexual activity: Yes    Partners: Male    Birth control/protection: Post-menopausal  Lifestyle  . Physical activity    Days per week: Not on file    Minutes per session: Not on file  . Stress: Not on file  Relationships  . Social Musicianconnections    Talks on phone: Not on file    Gets together: Not on file    Attends religious service: Not on file    Active member of club or organization: Not on file    Attends meetings of clubs or organizations: Not on file    Relationship status: Not on file  . Intimate partner violence    Fear of current or ex partner: Not on file    Emotionally abused: Not on file    Physically abused: Not on file    Forced sexual activity: Not on file  Other Topics Concern  . Not on file  Social History Narrative  . Not on file     Current Outpatient Medications:  .  atorvastatin (LIPITOR) 40 MG tablet, Take 1 tablet (40 mg total) by mouth daily., Disp: 90 tablet, Rfl: 3 .  Azelastine-Fluticasone 137-50 MCG/ACT SUSP, Place 2 sprays into the nose daily., Disp: 23 g, Rfl: 5 .  busPIRone (BUSPAR) 7.5 MG tablet, Take 1 tablet at night x7 days; then take twice daily x7 days; then may take 3rd dose as needed during the day, Disp: 90 tablet, Rfl: 0 .  chlorthalidone (HYGROTON) 25 MG tablet, Take 0.5 tablets (12.5 mg total) by mouth daily., Disp: 90 tablet, Rfl: 1 .  escitalopram (LEXAPRO) 20 MG tablet, Take 1 tablet (20 mg total) by mouth daily., Disp: 30 tablet, Rfl: 1 .   fexofenadine (ALLEGRA) 180 MG tablet, Take 1 tablet (180 mg total) by mouth daily., Disp: 90 tablet, Rfl: 1 .  pantoprazole (PROTONIX) 40 MG tablet, Take once daily as needed for heartburn/reflux symptoms., Disp: 90 tablet, Rfl: 1  Allergies  Allergen Reactions  . Bee Venom     I personally reviewed active problem list, medication list, allergies, health maintenance, notes from last encounter, lab results with the patient/caregiver today.   ROS  Ten systems reviewed and is negative except as mentioned in HPI  Objective  Vitals:   08/08/19 0834  Pulse: 96  Resp: 16  Temp: 97.9 F (36.6 C)  TempSrc: Temporal  SpO2: 95%  Weight: 167 lb 3.2 oz (75.8 kg)  Height: 5\' 5"  (1.651 m)    Body mass index is 27.82 kg/m.  Physical Exam  Constitutional: Patient appears well-developed and well-nourished. No distress.  HENT: Head: Normocephalic and atraumatic.  Eyes: Conjunctivae and EOM are normal. No scleral icterus. Neck: Normal range of motion. Neck supple. No JVD present.  Cardiovascular: Normal rate, regular rhythm and normal heart sounds.  No murmur heard. No BLE edema. Pulmonary/Chest: Effort normal and breath sounds normal. No respiratory distress. Musculoskeletal: Normal range of motion, no joint effusions. No gross deformities Neurological: Pt is alert and oriented to person, place, and time. No cranial nerve deficit. Coordination, balance, strength, speech and gait are normal.  Skin: Skin is warm and dry. No rash noted. No erythema.  Psychiatric: Patient has a normal mood and affect. behavior is normal. Judgment and thought content normal.  No results found for this or any previous visit (from the past 72 hour(s)).  PHQ2/9: Depression screen [facility] 2/9 08/08/2019 01/17/2019 10/04/2018 07/17/2018 06/25/2018  Decreased Interest 0 1 2 1 2   Down, Depressed, Hopeless 1 1 3 1 1   PHQ - 2 Score 1 2 5 2 3   Altered sleeping 1 1 1 2 1   Tired, decreased energy 0 1 1 1 1   Change in  appetite 2 1 3  0 1  Feeling bad or failure about yourself  1 2 2 1 2   Trouble concentrating 2 2 2  0 3  Moving slowly or fidgety/restless 0 0 2 0 3  Suicidal thoughts 0 0 0 0 0  PHQ-9 Score 7 9 16 6 14   Difficult doing work/chores Somewhat difficult Somewhat difficult Extremely dIfficult Somewhat difficult Somewhat difficult   PHQ-2/9 Result is positive.    Fall Risk: Fall Risk  08/08/2019 01/17/2019 10/04/2018 07/17/2018 06/25/2018  Falls in the past year? 0 0 0 No No  Number falls in past yr: 0 0 - - -  Injury with Fall? 0 0 0 - -  Follow up Falls evaluation completed Falls evaluation completed - - -    Assessment & Plan  1. Current moderate episode of major depressive disorder without prior episode ([place]) - Restart daily Lexapro. - busPIRone (BUSPAR) 7.5 MG tablet; Take 1 tablet at night x7 days; then take twice daily x7 days; then may take 3rd dose as needed during the day  Dispense: 90 tablet; Refill: 2 - escitalopram (LEXAPRO) 20 MG tablet; Take 1 tablet (20 mg total) by mouth daily.  Dispense: 90 tablet; Refill: 2  2. Anxiety - busPIRone (BUSPAR) 7.5 MG tablet; Take 1 tablet at night x7 days; then take twice daily x7 days; then may take 3rd dose as needed during the day  Dispense: 90 tablet; Refill: 2 - escitalopram (LEXAPRO) 20 MG tablet; Take 1 tablet (20 mg total) by mouth daily.  Dispense: 90 tablet; Refill: 2  3. Essential hypertension - chlorthalidone (HYGROTON) 25 MG tablet; Take 0.5 tablets (12.5 mg total) by mouth daily.  Dispense: 90 tablet; Refill: 2 - COMPLETE METABOLIC PANEL WITH GFR  4. Overweight (BMI 25.0-29.9) - Discussed importance of 150 minutes of physical activity weekly, eat two servings of fish weekly, eat one serving of tree nuts ( cashews, pistachios, pecans, almonds.Marland Kitchen) every other day, eat 6 servings of fruit/vegetables daily and drink plenty of water and avoid sweet beverages.  - COMPLETE METABOLIC PANEL WITH GFR - Lipid panel  5. Gastroesophageal  reflux disease without esophagitis - Controlled on PRN Pantoprazole - pantoprazole (PROTONIX) 40 MG  tablet; Take once daily as needed for heartburn/reflux symptoms.  Dispense: 90 tablet; Refill: 1 - COMPLETE METABOLIC PANEL WITH GFR  6. Hyperlipidemia, unspecified hyperlipidemia type - Continue statin therapy - Lipid panel  7. Other allergic rhinitis - Continue allegra and dymista daily.  8. Seasonal allergic rhinitis, unspecified trigger - Continue allegra and dymista daily. - Azelastine-Fluticasone 137-50 MCG/ACT SUSP; Place 2 sprays into the nose daily.  Dispense: 23 g; Refill: 5 - fexofenadine (ALLEGRA) 180 MG tablet; Take 1 tablet (180 mg total) by mouth daily.  Dispense: 90 tablet; Refill: 1  9. Needs flu shot - Flu Vaccine QUAD 6+ mos PF IM (Fluarix Quad PF)

## 2019-08-09 LAB — COMPLETE METABOLIC PANEL WITH GFR
AG Ratio: 1.6 (calc) (ref 1.0–2.5)
ALT: 21 U/L (ref 6–29)
AST: 28 U/L (ref 10–35)
Albumin: 4.4 g/dL (ref 3.6–5.1)
Alkaline phosphatase (APISO): 68 U/L (ref 37–153)
BUN/Creatinine Ratio: 15 (calc) (ref 6–22)
BUN: 17 mg/dL (ref 7–25)
CO2: 31 mmol/L (ref 20–32)
Calcium: 9.6 mg/dL (ref 8.6–10.4)
Chloride: 98 mmol/L (ref 98–110)
Creat: 1.15 mg/dL — ABNORMAL HIGH (ref 0.50–1.05)
GFR, Est African American: 64 mL/min/{1.73_m2} (ref >=60)
GFR, Est Non African American: 55 mL/min/{1.73_m2} — ABNORMAL LOW (ref >=60)
Globulin: 2.7 g/dL (calc) (ref 1.9–3.7)
Glucose, Bld: 93 mg/dL (ref 65–99)
Potassium: 3.5 mmol/L (ref 3.5–5.3)
Sodium: 139 mmol/L (ref 135–146)
Total Bilirubin: 0.4 mg/dL (ref 0.2–1.2)
Total Protein: 7.1 g/dL (ref 6.1–8.1)

## 2019-08-09 LAB — LIPID PANEL
Cholesterol: 208 mg/dL — ABNORMAL HIGH (ref <200)
HDL: 73 mg/dL (ref >=50)
LDL Cholesterol (Calc): 109 mg/dL (calc) — ABNORMAL HIGH
Non-HDL Cholesterol (Calc): 135 mg/dL (calc) — ABNORMAL HIGH (ref <130)
Total CHOL/HDL Ratio: 2.8 (calc) (ref <5.0)
Triglycerides: 145 mg/dL (ref <150)

## 2019-09-01 IMAGING — MG DIGITAL SCREENING BILATERAL MAMMOGRAM WITH TOMO AND CAD
6 of 10 series · 6 of 30 positions shown · non-contrast
Comparison: Previous exam(s).

CLINICAL DATA: Screening.

EXAM:
DIGITAL SCREENING BILATERAL MAMMOGRAM WITH TOMO AND CAD

[L CC synth-2D]
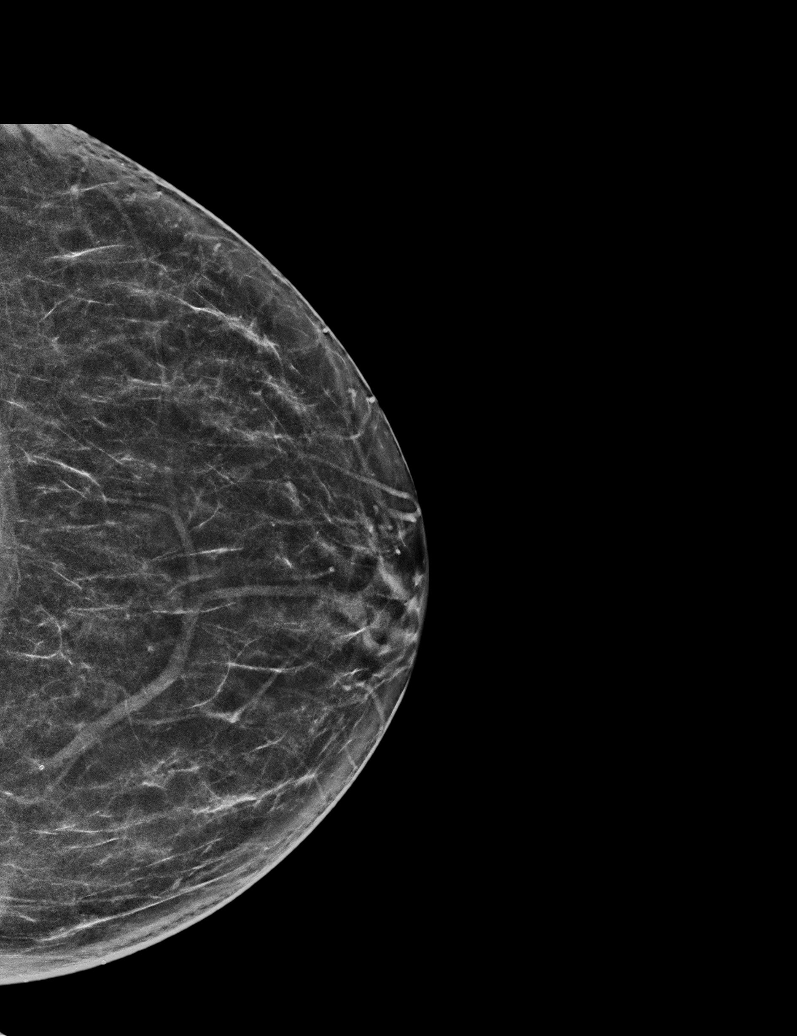

[L MLO synth-2D]
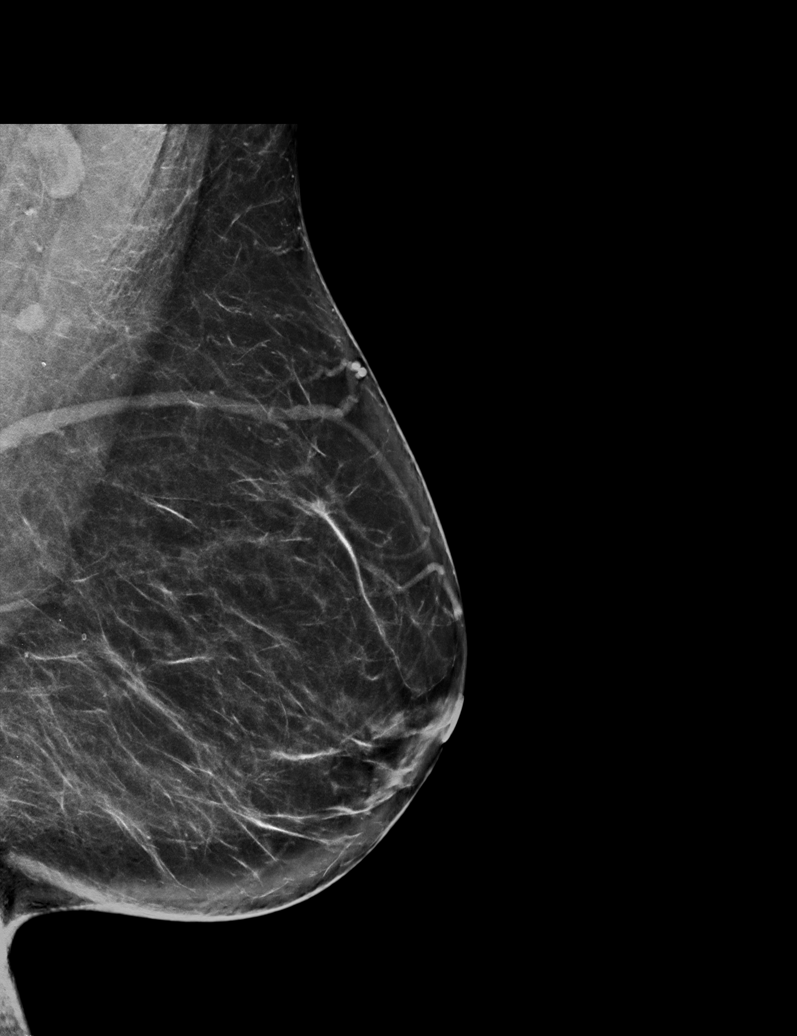

[R MLO synth-2D (1 of 2)]
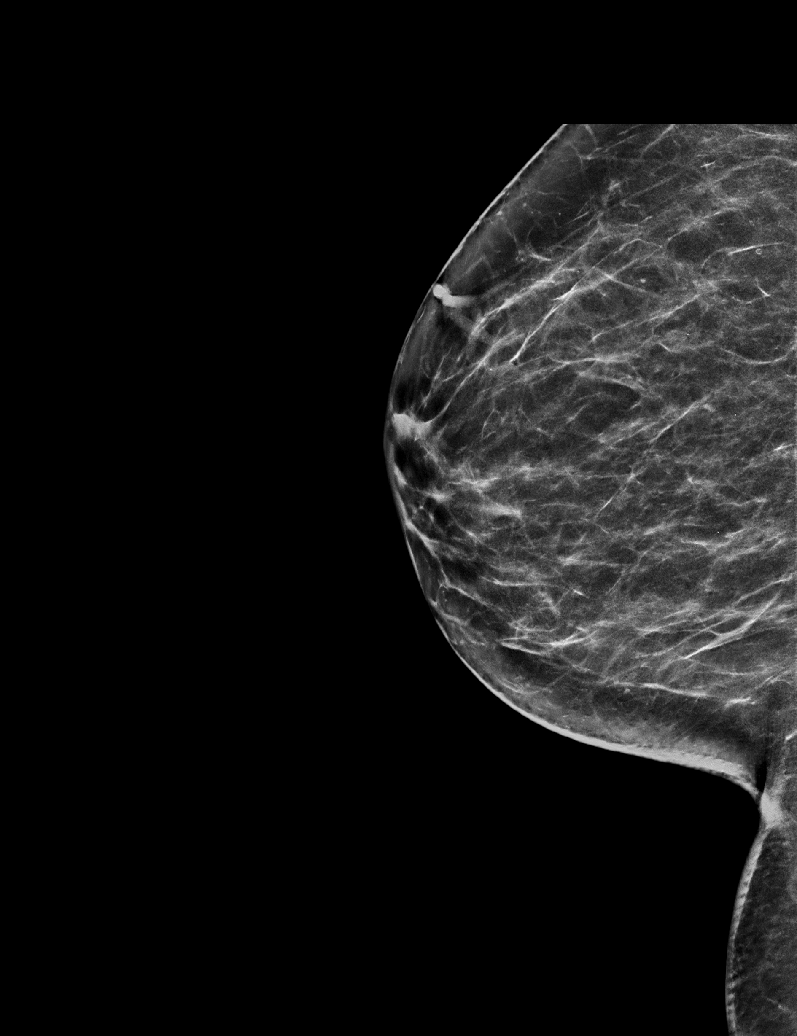

[R CC synth-2D]
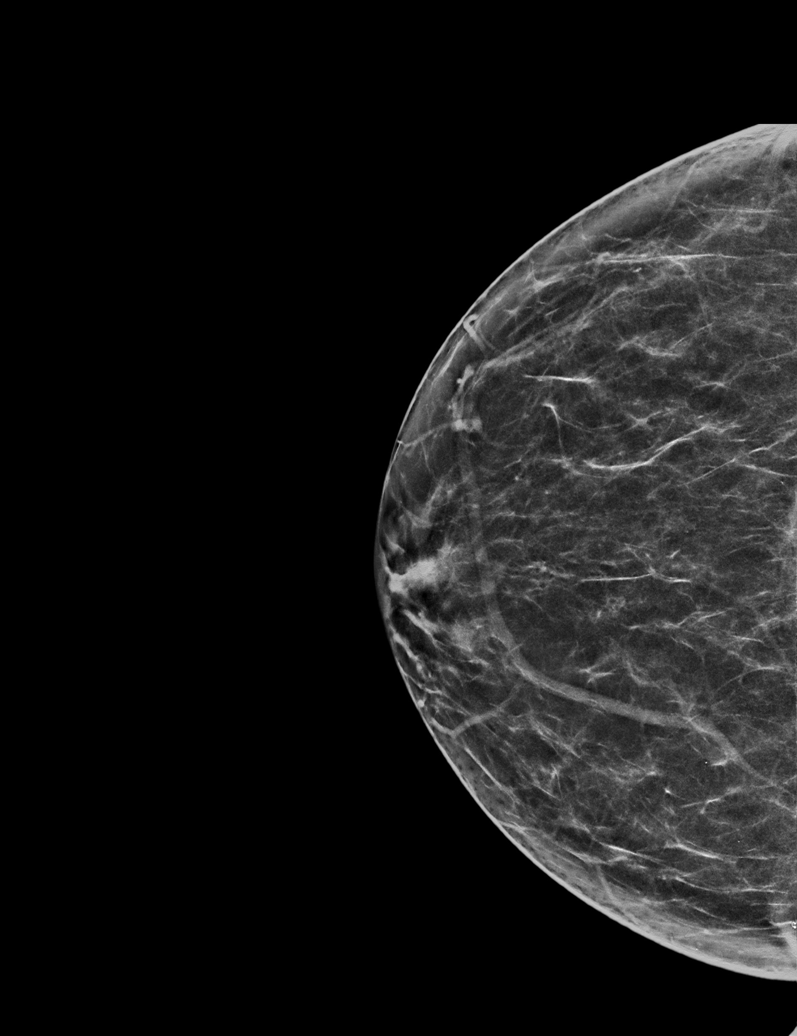

[R MLO synth-2D (2 of 2)]
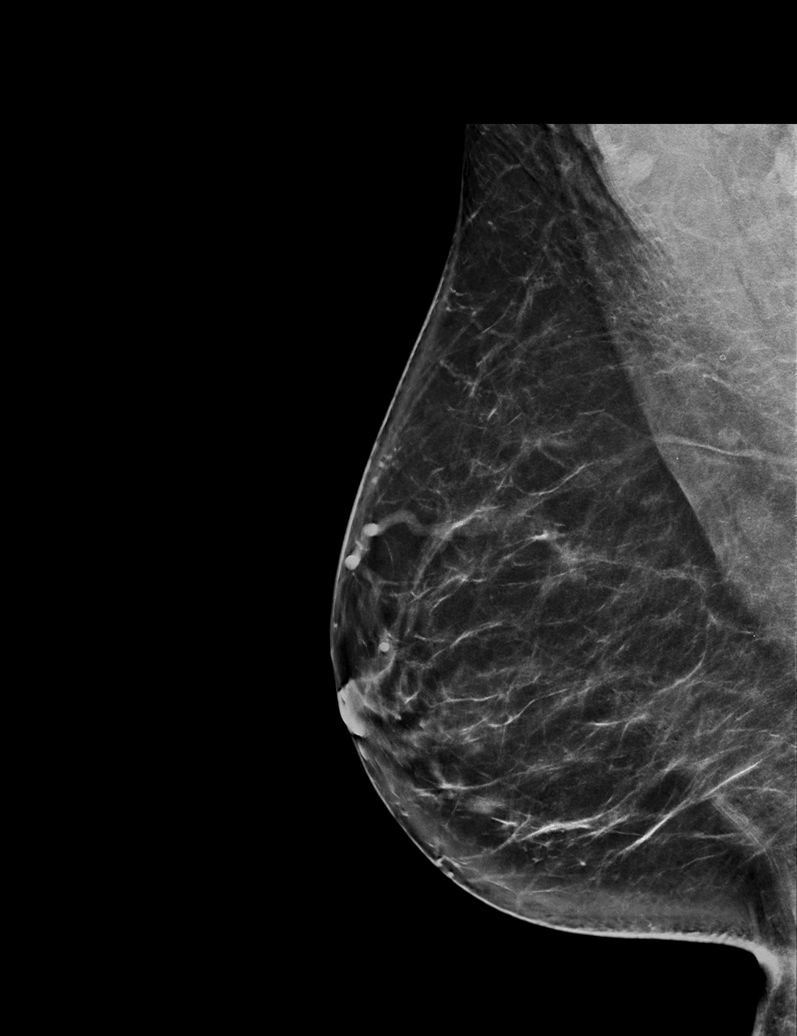

[L CC tomo · tomo slice 31/61.0]
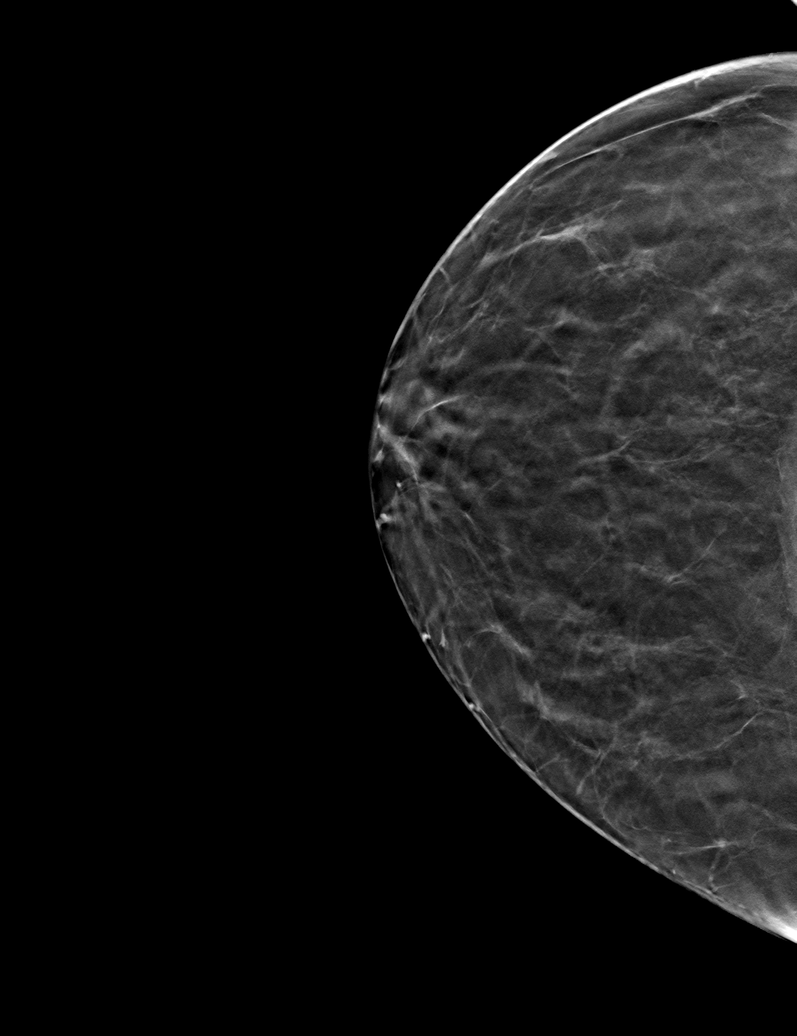

[6 of 30 positions shown; findings below may reference images not displayed]

ACR Breast Density Category b: There are scattered areas of
fibroglandular density.
FINDINGS: There are no findings suspicious for malignancy. Images were
processed with CAD.
IMPRESSION: No mammographic evidence of malignancy. A result letter of this
screening mammogram will be mailed directly to the patient.

RECOMMENDATION:
Screening mammogram in one year. (Code:[id number])

BI-RADS CATEGORY  1: Negative.

## 2019-10-01 ENCOUNTER — Other Ambulatory Visit: Payer: Self-pay | Admitting: Emergency Medicine

## 2019-10-01 ENCOUNTER — Telehealth: Payer: Self-pay | Admitting: Family Medicine

## 2019-10-01 DIAGNOSIS — E782 Mixed hyperlipidemia: Secondary | ICD-10-CM

## 2019-10-01 MED ORDER — ATORVASTATIN CALCIUM 40 MG PO TABS: 40.0000 mg | ORAL_TABLET | Freq: Every day | ORAL | 3 refills | Status: DC

## 2019-10-01 NOTE — Telephone Encounter (Signed)
Statin therapy is refilled - provided atorvastatin as this is what I have been prescribing her.  For her FMLA - I cannot re-do this - per the FMLA filing from last year, she was awaiting psychiatry at that time and it was meant to be a bridge.  She never went to see them, and I am okay with managing her medications, but additional FMLA can't be provided - it needs to come from specialty now.

## 2019-10-01 NOTE — Telephone Encounter (Signed)
Pt calling to let us know that she is having FMLA papers sent over and would like a call back from the nurse. Please advise

## 2019-10-01 NOTE — Telephone Encounter (Signed)
Need lovastatin called to pharmacy. I called patient and she stated her bottle say Lovastatin and not Atorvastatin. Will bring paperwork to office to have filled out regarding anxiety

## 2019-10-01 NOTE — Telephone Encounter (Signed)
Need refill for Lovastatin 40mg     [facility] [employer] also need a letter for her anxiety that was given to her last year and need to be updated for this year

## 2019-10-02 NOTE — Telephone Encounter (Signed)
paperwork is here and waiting on Raquel Sarna to fill out

## 2019-10-09 ENCOUNTER — Telehealth: Payer: Self-pay | Admitting: Family Medicine

## 2019-10-09 DIAGNOSIS — E785 Hyperlipidemia, unspecified: Secondary | ICD-10-CM

## 2019-10-09 DIAGNOSIS — K219 Gastro-esophageal reflux disease without esophagitis: Secondary | ICD-10-CM

## 2019-10-09 NOTE — Telephone Encounter (Signed)
Requested medication (s) are due for refill today: no  Requested medication (s) are on the active medication list: no  Last refill: 09/03/2019  Future visit scheduled: yes  Notes to clinic:  Medication was discontinued    Requested Prescriptions  Pending Prescriptions Disp Refills   lovastatin (MEVACOR) 40 MG tablet [Pharmacy Med Name: LOVASTATIN 40MG  TABLETS] 90 tablet 0    Sig: TAKE 1 TABLET BY MOUTH EVERY NIGHT AT BEDTIME     Cardiovascular:  Antilipid - Statins Failed - 10/09/2019 10:44 AM      Failed - Total Cholesterol in normal range and within 360 days    Cholesterol, Total  Date Value Ref Range Status  09/01/2017 247 (H) 100 - 199 mg/dL Final   Cholesterol  Date Value Ref Range Status  08/08/2019 208 (H) <200 mg/dL Final         Failed - LDL in normal range and within 360 days    LDL Cholesterol (Calc)  Date Value Ref Range Status  08/08/2019 109 (H) mg/dL (calc) Final    Comment:    Reference range: <100 . Desirable range <100 mg/dL for primary prevention;   <70 mg/dL for patients with CHD or diabetic patients  with > or = 2 CHD risk factors. Marland Kitchen LDL-C is now calculated using the Martin-Hopkins  calculation, which is a validated novel method providing  better accuracy than the Friedewald equation in the  estimation of LDL-C.  Cresenciano Genre et al. Annamaria Helling. 3220;254(27): 2061-2068  (http://education.[employer].com/faq/FAQ164)          Passed - HDL in normal range and within 360 days    HDL  Date Value Ref Range Status  08/08/2019 73 > OR = 50 mg/dL Final  09/01/2017 78 >39 mg/dL Final         Passed - Triglycerides in normal range and within 360 days    Triglycerides  Date Value Ref Range Status  08/08/2019 145 <150 mg/dL Final         Passed - Patient is not pregnant      Passed - Valid encounter within last 12 months    Recent Outpatient Visits          2 months ago Current moderate episode of major depressive disorder without prior episode  [facility])   [place], FNP   8 months ago Current moderate episode of major depressive disorder without prior episode [facility])   [place], FNP   1 year ago Current 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

## 2019-10-10 NOTE — Telephone Encounter (Signed)
Her insurance will not pay for any of the PPI class that I have tried in the past.  If she wants to try cash pay she can look at [facility] for coupon - [facility] is the most expensive for protonix per [facility] at $140/90 day supply.  [facility], [facility], and [facility] are much cheaper.  Let me know if she'd like a paper script to shop around or if she'd like me to send it to a different pharmacy.

## 2019-10-10 NOTE — Telephone Encounter (Signed)
Patient notified she is to be taking Atorvastatin. Patient bverbalized understanding and got things straight with pharmacy. SHe now need something called in for acid reflux. Insurance will not pay for Protonix

## 2019-10-11 NOTE — Telephone Encounter (Signed)
Patient notified

## 2019-10-11 NOTE — Telephone Encounter (Signed)
Patient to call insurance company to see what they will pay for

## 2019-10-27 DIAGNOSIS — E28319 Asymptomatic premature menopause: Secondary | ICD-10-CM | POA: Insufficient documentation

## 2019-10-27 NOTE — Progress Notes (Signed)
PCP: Hubbard Hartshorn, FNP   Chief Complaint  Patient presents with  . Gynecologic Exam    HPI:      [name] is a 53 y.o. G1P1001 who LMP was No LMP recorded. Patient is postmenopausal., presents today for her annual examination.  Her menses are absent due to premature menopause (age 23). She does not have intermenstrual bleeding. She has occas vasomotor sx.   Sex activity: single partner, contraception - post menopausal status. She does not have vaginal dryness.  Last Pap: 10/16/18  Results were: no abnormalities /neg HPV DNA.  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  Last mammogram: 10/24/18 Results were: normal--routine follow-up in 12 months.  There is a FH of breast cancer in her mat aunt and ovarian cancer in her MGM. Pt's sister is BRCA neg. Pt has declined genetic testing in past. The patient does do self-breast exams.  Colonoscopy: 2019 Repeat due after 5 years. Father with colon cancer.  DEXA: never  Tobacco use: The patient denies current or previous tobacco use. Alcohol use: social drinker  No drug use. Exercise: moderately active  She does get adequate calcium and Vitamin D in her diet.  Labs with PCP.   Past Medical History:  Diagnosis Date  . Anxiety 2019  . Family history of colon cancer   . Family history of ovarian cancer   . GERD (gastroesophageal reflux disease)   . History of abnormal cervical Pap smear    positive HRHPV  . History of mammogram 10/04/2016   neg  . Hyperlipidemia     Past Surgical History:  Procedure Laterality Date  . COLONOSCOPY  2010   Dr Adair Laundry  . COLONOSCOPY W/ BIOPSIES  03/02/2018   hyperplastic polyp-repeat in 5 years/ Dr Alice Reichert  . DILATION AND CURETTAGE OF UTERUS    . 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 TUBAL LIGATION      Family History  Problem Relation Age of Onset  . Colon cancer Father 50  . Hypertension Father   . Breast  cancer Maternal Aunt 69       has contact  . Ovarian cancer Maternal Grandmother 75  . Hypertension Mother   . Hypertension Sister     Social History   Socioeconomic History  . Marital status: Married    Spouse name: Not on file  . Number of children: 1  . Years of education: Not on file  . Highest education level: Not on file  Occupational History  . Occupation: Chiropodist  Social Needs  . Financial resource strain: Not on file  . Food insecurity    Worry: Not on file    Inability: Not on file  . Transportation needs    Medical: Not on file    Non-medical: Not on file  Tobacco Use  . Smoking status: Never Smoker  . Smokeless tobacco: Never Used  Substance and Sexual Activity  . Alcohol use: Yes    Alcohol/week: 0.0 standard drinks    Comment: occasionally on weekend  . Drug use: No  . Sexual activity: Yes    Partners: Male    Birth control/protection: Post-menopausal  Lifestyle  . Physical activity    Days per week: Not on file    Minutes per session: Not on file  . Stress: Not on file  Relationships  . Social Herbalist on  phone: Not on file    Gets together: Not on file    Attends religious service: Not on file    Active member of club or organization: Not on file    Attends meetings of clubs or organizations: Not on file    Relationship status: Not on file  . Intimate partner violence    Fear of current or ex partner: Not on file    Emotionally abused: Not on file    Physically abused: Not on file    Forced sexual activity: Not on file  Other Topics Concern  . Not on file  Social History Narrative  . Not on file    Outpatient Medications Prior to Visit  Medication Sig Dispense Refill  . atorvastatin (LIPITOR) 40 MG tablet Take 1 tablet (40 mg total) by mouth daily. 90 tablet 3  . Azelastine-Fluticasone 137-50 MCG/ACT SUSP Place 2 sprays into the nose daily. 23 g 5  . busPIRone (BUSPAR) 7.5 MG tablet Take 1 tablet at night x7  days; then take twice daily x7 days; then may take 3rd dose as needed during the day 90 tablet 2  . chlorthalidone (HYGROTON) 25 MG tablet Take 0.5 tablets (12.5 mg total) by mouth daily. 90 tablet 2  . escitalopram (LEXAPRO) 20 MG tablet Take 1 tablet (20 mg total) by mouth daily. 90 tablet 2  . fexofenadine (ALLEGRA) 180 MG tablet Take 1 tablet (180 mg total) by mouth daily. 90 tablet 1  . pantoprazole (PROTONIX) 40 MG tablet Take once daily as needed for heartburn/reflux symptoms. 90 tablet 1   No facility-administered medications prior to visit.        ROS:  Review of Systems  Constitutional: Negative for fatigue, fever and unexpected weight change.  Respiratory: Negative for cough, shortness of breath and wheezing.   Cardiovascular: Negative for chest pain, palpitations and leg swelling.  Gastrointestinal: Negative for blood in stool, constipation, diarrhea, nausea and vomiting.  Endocrine: Negative for cold intolerance, heat intolerance and polyuria.  Genitourinary: Negative for dyspareunia, dysuria, flank pain, frequency, genital sores, hematuria, menstrual problem, pelvic pain, urgency, vaginal bleeding, vaginal discharge and vaginal pain.  Musculoskeletal: Negative for back pain, joint swelling and myalgias.  Skin: Negative for rash.  Neurological: Negative for dizziness, syncope, light-headedness, numbness and headaches.  Hematological: Negative for adenopathy.  Psychiatric/Behavioral: Positive for agitation. Negative for confusion, sleep disturbance and suicidal ideas. The patient is not nervous/anxious.   BREAST: No symptoms    Objective: BP 130/90   Ht _0  (1.651 m)   Wt 175 lb (79.4 kg)   BMI 29.12 kg/m    Physical Exam Constitutional:      Appearance: She is well-developed.  Genitourinary:     Vulva, vagina, uterus, right adnexa and left adnexa normal.     No vulval lesion or tenderness noted.     No vaginal discharge, erythema or tenderness.     No  cervical motion tenderness or polyp.     Uterus is not enlarged or tender.     No right or left adnexal mass present.     Right adnexa not tender.     Left adnexa not tender.  Neck:     Musculoskeletal: Normal range of motion.     Thyroid: No thyromegaly.  Cardiovascular:     Rate and Rhythm: Normal rate and regular rhythm.     Heart sounds: Normal heart sounds. No murmur.  Pulmonary:     Effort: Pulmonary effort is normal.  Breath sounds: Normal breath sounds.  Chest:     Breasts:        Right: No mass, nipple discharge, skin change or tenderness.        Left: No mass, nipple discharge, skin change or tenderness.  Abdominal:     Palpations: Abdomen is soft.     Tenderness: There is no abdominal tenderness. There is no guarding.  Musculoskeletal: Normal range of motion.  Neurological:     General: No focal deficit present.     Mental Status: She is alert and oriented to person, place, and time.     Cranial Nerves: No cranial nerve deficit.  Skin:    General: Skin is warm and dry.  Psychiatric:        Mood and Affect: Mood normal.        Behavior: Behavior normal.        Thought Content: Thought content normal.        Judgment: Judgment normal.  Vitals signs reviewed.     Assessment/Plan:  Encounter for annual routine gynecological examination  Encounter for screening mammogram for malignant neoplasm of breast - Plan: 3D MAMMOGRAM SCREENING BILATERAL; pt to sched mammo  Screening for osteoporosis--DEXA discussed. Pt to see if insurance covers and will call for appt if so.   Early menopause  Family history of ovarian cancer--MyRisk testing discussed. Pt to call for test if desires.   Family history of colon cancer--Doing Q5 yr colonoscopies.         GYN counsel breast self exam, mammography screening, menopause, adequate intake of calcium and vitamin D, diet and exercise    F/U  Return in about 1 year (around 10/27/2020).   B. ,  PA-C 10/28/2019 8:27 AM

## 2019-10-28 ENCOUNTER — Other Ambulatory Visit: Payer: Self-pay

## 2019-10-28 ENCOUNTER — Ambulatory Visit (INDEPENDENT_AMBULATORY_CARE_PROVIDER_SITE_OTHER): Payer: Commercial Managed Care - PPO | Admitting: Obstetrics and Gynecology

## 2019-10-28 ENCOUNTER — Ambulatory Visit
Admission: RE | Admit: 2019-10-28 | Discharge: 2019-10-28 | Disposition: A | Discharge status: Home / Self Care | Payer: Commercial Managed Care - PPO | Source: Ambulatory Visit | Attending: Obstetrics and Gynecology | Admitting: Obstetrics and Gynecology

## 2019-10-28 ENCOUNTER — Encounter: Payer: Self-pay | Admitting: Obstetrics and Gynecology

## 2019-10-28 VITALS — BP 130/90 | Ht 65.0 in | Wt 175.0 lb

## 2019-10-28 DIAGNOSIS — Z8041 Family history of malignant neoplasm of ovary: Secondary | ICD-10-CM

## 2019-10-28 DIAGNOSIS — Z1231 Encounter for screening mammogram for malignant neoplasm of breast: Secondary | ICD-10-CM | POA: Diagnosis not present

## 2019-10-28 DIAGNOSIS — Z01419 Encounter for gynecological examination (general) (routine) without abnormal findings: Secondary | ICD-10-CM

## 2019-10-28 DIAGNOSIS — E28319 Asymptomatic premature menopause: Secondary | ICD-10-CM

## 2019-10-28 DIAGNOSIS — Z13820 Encounter for screening for osteoporosis: Secondary | ICD-10-CM

## 2019-10-28 DIAGNOSIS — Z800 Family history of malignant neoplasm of digestive organs: Secondary | ICD-10-CM

## 2019-10-28 NOTE — Patient Instructions (Signed)
I value your feedback and entrusting us with your care. If you get a [place] patient survey, I would appreciate you taking the time to let us know about your experience today. Thank you!  [facility]: [phone]    

## 2019-11-25 ENCOUNTER — Telehealth: Payer: Self-pay | Admitting: Family Medicine

## 2019-11-25 NOTE — Telephone Encounter (Signed)
Pt got fax number

## 2019-11-25 NOTE — Telephone Encounter (Signed)
Patient is calling is to request fax number. Patient will be faxing a form to be completed to be returned to [facility] to verify that the patient have biometric screening completed for the year. Please advise when the form is competed with the patient Cb- [phone]

## 2019-11-26 NOTE — Telephone Encounter (Signed)
Pt is calling to see if office received the form she fax over yesterday and check on the turn around time. The form needs to be refax asap

## 2019-11-26 NOTE — Telephone Encounter (Signed)
Form waiting on signature and will fax

## 2019-11-27 NOTE — Telephone Encounter (Signed)
Pt is calling and would like the form to be fax to her at [phone]

## 2019-11-27 NOTE — Telephone Encounter (Signed)
Paperwork will be faxed on 12/31

## 2019-12-31 ENCOUNTER — Other Ambulatory Visit: Payer: Self-pay

## 2019-12-31 ENCOUNTER — Ambulatory Visit (INDEPENDENT_AMBULATORY_CARE_PROVIDER_SITE_OTHER): Payer: Commercial Managed Care - PPO | Admitting: Family Medicine

## 2019-12-31 ENCOUNTER — Encounter: Payer: Self-pay | Admitting: Family Medicine

## 2019-12-31 VITALS — Ht 65.0 in | Wt 176.0 lb

## 2019-12-31 DIAGNOSIS — R05 Cough: Secondary | ICD-10-CM

## 2019-12-31 DIAGNOSIS — R058 Other specified cough: Secondary | ICD-10-CM

## 2019-12-31 DIAGNOSIS — J0100 Acute maxillary sinusitis, unspecified: Secondary | ICD-10-CM

## 2019-12-31 MED ORDER — AMOXICILLIN-POT CLAVULANATE 875-125 MG PO TABS: 1.0000 | ORAL_TABLET | Freq: Two times a day (BID) | ORAL | 0 refills | Status: AC

## 2019-12-31 MED ORDER — BENZONATATE 100 MG PO CAPS: 100.0000 mg | ORAL_CAPSULE | Freq: Three times a day (TID) | ORAL | 0 refills | Status: DC | PRN

## 2019-12-31 NOTE — Progress Notes (Signed)
Name: Cassidy Collins   MRN: [id number]    DOB: Sep 22, 1966   Date:12/31/2019       Progress Note  Subjective  Chief Complaint  Chief Complaint  Patient presents with  . Sinus Problem    Onset-couple of weeks, took otc medication, now having facial pain and swelling.  . Nasal Congestion    Has tried Aleve Cold and Sinus and Mucinex    I connected with  [name]  on 12/31/19 at 11:00 AM EST by a video enabled telemedicine application and verified that I am speaking with the correct person using two identifiers.  I discussed the limitations of evaluation and management by telemedicine and the availability of in person appointments. The patient expressed understanding and agreed to proceed. Staff also discussed with the patient that there may be a patient responsible charge related to this service. Patient Location: Home Provider Location: Office Additional Individuals present: None  HPI  Pt presents with concern for sinus pain and pressure - started 3 weeks ago, but had significant worsening over the last several days.  She notes sinus headaches behind her eyes, some puffiness just below maxillary sinuses; has tried Aleve cold and sinus and Mucinex; also using her daily antihistamine and nasal sprays. She did not have COVID-19 testing when her symptoms started.  Endorses PND.  She denies fevers/chills, vision changes, no ear pain/pressure, chest pain, shortness of breath.  Patient Active Problem List   Diagnosis Date Noted  . Early menopause 10/27/2019  . Current moderate episode of major depressive disorder without prior episode (HCC) 01/17/2019  . Anxiety 01/17/2019  . Essential hypertension 01/17/2019  . Family history of colon cancer   . Family history of ovarian cancer   . Hyperlipidemia 09/01/2017  . Overweight (BMI 25.0-29.9) 09/01/2017  . Other allergic rhinitis 09/01/2017  . Gastroesophageal reflux disease without esophagitis 09/01/2017    Social  History   Tobacco Use  . Smoking status: Never Smoker  . Smokeless tobacco: Never Used  Substance Use Topics  . Alcohol use: Yes    Alcohol/week: 0.0 standard drinks    Comment: occasionally on weekend     Current Outpatient Medications:  .  atorvastatin (LIPITOR) 40 MG tablet, Take 1 tablet (40 mg total) by mouth daily., Disp: 90 tablet, Rfl: 3 .  Azelastine-Fluticasone 137-50 MCG/ACT SUSP, Place 2 sprays into the nose daily., Disp: 23 g, Rfl: 5 .  busPIRone (BUSPAR) 7.5 MG tablet, Take 1 tablet at night x7 days; then take twice daily x7 days; then may take 3rd dose as needed during the day, Disp: 90 tablet, Rfl: 2 .  chlorthalidone (HYGROTON) 25 MG tablet, Take 0.5 tablets (12.5 mg total) by mouth daily., Disp: 90 tablet, Rfl: 2 .  escitalopram (LEXAPRO) 20 MG tablet, Take 1 tablet (20 mg total) by mouth daily., Disp: 90 tablet, Rfl: 2 .  fexofenadine (ALLEGRA) 180 MG tablet, Take 1 tablet (180 mg total) by mouth daily., Disp: 90 tablet, Rfl: 1 .  pantoprazole (PROTONIX) 40 MG tablet, Take once daily as needed for heartburn/reflux symptoms., Disp: 90 tablet, Rfl: 1  Allergies  Allergen Reactions  . Bee Venom     I personally reviewed active problem list, medication list, allergies with the patient/caregiver today.  ROS  Ten systems reviewed and is negative except as mentioned in HPI   Objective  Virtual encounter, vitals not obtained.  Body mass index is 29.29 kg/m.  Nursing Note and Vital Signs reviewed.  Physical Exam  Constitutional: Patient appears well-developed and well-nourished. No distress.  HENT: Head: Normocephalic and atraumatic.  Neck: Normal range of motion. Pulmonary/Chest: Effort normal. No respiratory distress. Speaking in complete sentences Neurological: Pt is alert and oriented to person, place, and time. Coordination, speech and gait are normal.  Psychiatric: Patient has a normal mood and affect. behavior is normal. Judgment and thought content  normal.  No results found for this or any previous visit (from the past 72 hour(s)).  Assessment & Plan  1. Acute non-recurrent maxillary sinusitis - amoxicillin-clavulanate (AUGMENTIN) 875-125 MG tablet; Take 1 tablet by mouth 2 (two) times daily for 10 days.  Dispense: 20 tablet; Refill: 0  2. Dry cough - benzonatate (TESSALON PERLES) 100 MG capsule; Take 1 capsule (100 mg total) by mouth 3 (three) times daily as needed.  Dispense: 20 capsule; Refill: 0 - Secondary to PND at night.   -Red flags and when to present for emergency care or RTC including fever >101.75F, chest pain, shortness of breath, new/worsening/un-resolving symptoms, reviewed with patient at time of visit. Follow up and care instructions discussed and provided in AVS. - I discussed the assessment and treatment plan with the patient. The patient was provided an opportunity to ask questions and all were answered. The patient agreed with the plan and demonstrated an understanding of the instructions.  I provided 10 minutes of non-face-to-face time during this encounter.  Hubbard Hartshorn, FNP

## 2020-02-05 ENCOUNTER — Ambulatory Visit: Payer: Commercial Managed Care - PPO | Admitting: Family Medicine

## 2020-02-25 ENCOUNTER — Ambulatory Visit: Payer: Commercial Managed Care - PPO | Admitting: Family Medicine

## 2020-02-28 ENCOUNTER — Other Ambulatory Visit: Payer: Self-pay

## 2020-02-28 ENCOUNTER — Ambulatory Visit: Payer: Commercial Managed Care - PPO | Admitting: Family Medicine

## 2020-02-28 ENCOUNTER — Encounter: Payer: Self-pay | Admitting: Family Medicine

## 2020-02-28 VITALS — BP 132/88 | HR 83 | Temp 98.1°F | Resp 14 | Ht 65.0 in | Wt 178.4 lb

## 2020-02-28 DIAGNOSIS — I10 Essential (primary) hypertension: Secondary | ICD-10-CM

## 2020-02-28 DIAGNOSIS — F419 Anxiety disorder, unspecified: Secondary | ICD-10-CM

## 2020-02-28 DIAGNOSIS — Z5181 Encounter for therapeutic drug level monitoring: Secondary | ICD-10-CM

## 2020-02-28 DIAGNOSIS — F321 Major depressive disorder, single episode, moderate: Secondary | ICD-10-CM

## 2020-02-28 DIAGNOSIS — E785 Hyperlipidemia, unspecified: Secondary | ICD-10-CM

## 2020-02-28 DIAGNOSIS — K219 Gastro-esophageal reflux disease without esophagitis: Secondary | ICD-10-CM | POA: Diagnosis not present

## 2020-02-28 DIAGNOSIS — J302 Other seasonal allergic rhinitis: Secondary | ICD-10-CM

## 2020-02-28 MED ORDER — PANTOPRAZOLE SODIUM 40 MG PO TBEC: 40.0000 mg | DELAYED_RELEASE_TABLET | Freq: Every day | ORAL | 3 refills | Status: DC

## 2020-02-28 MED ORDER — BUSPIRONE HCL 7.5 MG PO TABS: ORAL_TABLET | ORAL | 2 refills | Status: DC

## 2020-02-28 MED ORDER — FEXOFENADINE HCL 180 MG PO TABS: 180.0000 mg | ORAL_TABLET | Freq: Every day | ORAL | 1 refills | Status: DC

## 2020-02-28 MED ORDER — ESCITALOPRAM OXALATE 20 MG PO TABS: 20.0000 mg | ORAL_TABLET | Freq: Every day | ORAL | 2 refills | Status: DC

## 2020-02-28 MED ORDER — CHLORTHALIDONE 25 MG PO TABS: 12.5000 mg | ORAL_TABLET | Freq: Every day | ORAL | 2 refills | Status: DC

## 2020-02-28 NOTE — Progress Notes (Signed)
Name: Harvest Deist   MRN: [id number]    DOB: [birth year]   Date:02/28/2020       Progress Note  Chief Complaint  Patient presents with  . Follow-up  . Depression  . Hypertension  . Hyperlipidemia  . Gastroesophageal Reflux     Subjective:   Ericca Labra is a 54 y.o. female, presents to clinic for routine follow up on the conditions listed above.  Hyperlipidemia: Current Medication Regimen:  lipitor 40 mg daily, changed about a year ago lipids came down a little 133 to 109 Last Lipids: Lab Results  Component Value Date   CHOL 208 (H) 08/08/2019   HDL 73 08/08/2019   LDLCALC 109 (H) 08/08/2019   TRIG 145 08/08/2019   CHOLHDL 2.8 08/08/2019  - Current Diet:  Not much improvement and some backsliding with winter and COVID pandemic - Denies: Chest pain, shortness of breath, myalgias. - Documented aortic atherosclerosis? No - Risk factors for atherosclerosis: hypercholesterolemia and hypertension  Hypertension:  Currently managed on chlorthalidone 25 mg - she wants to get off meds Pt reports good med compliance and denies any SE.  No lightheadedness, hypotension, syncope. Blood pressure today is uncontrolled, mildly above goal of 130/80. BP Readings from Last 3 Encounters:  02/28/20 132/88  10/28/19 130/90  08/08/19 102/64   Pt denies CP, SOB, exertional sx, LE edema, palpitation, Ha's, visual disturbances Dietary efforts for BP?  Low salt.  GERD protonix 40 mg prescribed sept 2020, but pt never got it.  She endorses problems with getting at her pharmacy and states he was [facility] to [facility] but she still never got it and she continues to have symptoms.  She would like me to send it to both pharmacies and we did review good Rx coupons.  Encouraged her to use daily to prevent severe symptoms however if she is having only intermittent symptoms she also can take as needed.  Patient is also on BuSpar 7.5 mg tablets and Lexapro 20 mg.  She states that she is  taking Lexapro daily and BuSpar as needed.  She does have some weeks where her work is very stressful for her and she will take BuSpar occasionally.  Otherwise she states her moods are good and anxiety is fairly well controlled. Depression screen [facility] 2/9 02/28/2020 12/31/2019 08/08/2019 01/17/2019 10/04/2018  Decreased Interest - 0 0 1 2  Down, Depressed, Hopeless 1 0 1 1 3   PHQ - 2 Score 1 0 1 2 5   Altered sleeping 0 0 1 1 1   Tired, decreased energy 0 0 0 1 1  Change in appetite 0 0 2 1 3   Feeling bad or failure about yourself  0 0 1 2 2   Trouble concentrating 1 0 2 2 2   Moving slowly or fidgety/restless 0 0 0 0 2  Suicidal thoughts 0 0 0 0 0  PHQ-9 Score 2 0 7 9 16   Difficult doing work/chores Somewhat difficult Not difficult at all Somewhat difficult Somewhat difficult Extremely dIfficult    GAD 7 : Generalized Anxiety Score 01/17/2019 10/04/2018  Nervous, Anxious, on Edge 1 2  Control/stop worrying 2 1  Worry too much - different things 2 3  Trouble relaxing 0 2  Restless 2 1  Easily annoyed or irritable 2 3  Afraid - awful might happen 0 3  Total GAD 7 Score 9 15  Anxiety Difficulty Very difficult Somewhat difficult   Stressful depressive job -she feels that she is coping with  it well.   Patient Active Problem List   Diagnosis Date Noted  . Early menopause 10/27/2019  . Current moderate episode of major depressive disorder without prior episode (HCC) 01/17/2019  . Anxiety 01/17/2019  . Essential hypertension 01/17/2019  . Family history of colon cancer   . Family history of ovarian cancer   . Hyperlipidemia 09/01/2017  . Overweight (BMI 25.0-29.9) 09/01/2017  . Other allergic rhinitis 09/01/2017  . Gastroesophageal reflux disease without esophagitis 09/01/2017    Past Surgical History:  Procedure Laterality Date  . COLONOSCOPY  2010   Dr Vanetta Shawl  . COLONOSCOPY W/ BIOPSIES  03/02/2018   hyperplastic polyp-repeat in 5 years/ Dr Norma Fredrickson  . DILATION AND CURETTAGE OF UTERUS      . 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 TUBAL LIGATION      Family History  Problem Relation Age of Onset  . Colon cancer Father 75  . Hypertension Father   . Breast cancer Maternal Aunt 69       has contact  . Ovarian cancer Maternal Grandmother 75  . Hypertension Mother   . Hypertension Sister     Social History   Tobacco Use  . Smoking status: Never Smoker  . Smokeless tobacco: Never Used  Substance Use Topics  . Alcohol use: Yes    Alcohol/week: 0.0 standard drinks    Comment: occasionally on weekend  . Drug use: No      Current Outpatient Medications:  .  atorvastatin (LIPITOR) 40 MG tablet, Take 1 tablet (40 mg total) by mouth daily., Disp: 90 tablet, Rfl: 3 .  busPIRone (BUSPAR) 7.5 MG tablet, Take 1 tablet at night x7 days; then take twice daily x7 days; then may take 3rd dose as needed during the day, Disp: 90 tablet, Rfl: 2 .  chlorthalidone (HYGROTON) 25 MG tablet, Take 0.5 tablets (12.5 mg total) by mouth daily., Disp: 90 tablet, Rfl: 2 .  escitalopram (LEXAPRO) 20 MG tablet, Take 1 tablet (20 mg total) by mouth daily., Disp: 90 tablet, Rfl: 2 .  fexofenadine (ALLEGRA) 180 MG tablet, Take 1 tablet (180 mg total) by mouth daily., Disp: 90 tablet, Rfl: 1 .  pantoprazole (PROTONIX) 40 MG tablet, Take once daily as needed for heartburn/reflux symptoms., Disp: 90 tablet, Rfl: 1 .  Azelastine-Fluticasone 137-50 MCG/ACT SUSP, Place 2 sprays into the nose daily., Disp: 23 g, Rfl: 5  Allergies  Allergen Reactions  . Bee Venom     Chart Review Today: I personally reviewed active problem list, medication list, allergies, family history, social history, health maintenance, notes from last encounter, lab results, imaging with the patient/caregiver today.  Review of Systems  10 Systems reviewed and are negative for acute change except as noted in the HPI.   Objective:    Vitals:   02/28/20 1116  BP: 132/88  Pulse: 83  Resp: 14  Temp: 98.1 F (36.7  C)  SpO2: 97%  Weight: 178 lb 6.4 oz (80.9 kg)  Height: 5\' 5"  (1.651 m)    Body mass index is 29.69 kg/m.  Physical Exam Vitals and nursing note reviewed.  Constitutional:      General: She is not in acute distress.    Appearance: Normal appearance. She is well-developed. She is not ill-appearing, toxic-appearing or diaphoretic.     Interventions: Face mask in place.  HENT:     Head: Normocephalic and atraumatic.     Right Ear: External ear normal.     Left Ear:  External ear normal.  Eyes:     General: Lids are normal. No scleral icterus.       Right eye: No discharge.        Left eye: No discharge.     Conjunctiva/sclera: Conjunctivae normal.  Neck:     Trachea: Phonation normal. No tracheal deviation.  Cardiovascular:     Rate and Rhythm: Normal rate and regular rhythm.     Pulses: Normal pulses.          Radial pulses are 2+ on the right side and 2+ on the left side.       Posterior tibial pulses are 2+ on the right side and 2+ on the left side.     Heart sounds: Normal heart sounds. No murmur. No friction rub. No gallop.   Pulmonary:     Effort: Pulmonary effort is normal. No respiratory distress.     Breath sounds: Normal breath sounds. No stridor. No wheezing, rhonchi or rales.  Chest:     Chest wall: No tenderness.  Abdominal:     General: Bowel sounds are normal. There is no distension.     Palpations: Abdomen is soft.     Tenderness: There is no abdominal tenderness. There is no guarding or rebound.  Musculoskeletal:        General: No deformity. Normal range of motion.     Cervical back: Normal range of motion and neck supple.     Right lower leg: No edema.     Left lower leg: No edema.  Lymphadenopathy:     Cervical: No cervical adenopathy.  Skin:    General: Skin is warm and dry.     Capillary Refill: Capillary refill takes less than 2 seconds.     Coloration: Skin is not jaundiced or pale.     Findings: No rash.  Neurological:     Mental Status: She  is alert and oriented to person, place, and time.     Motor: No abnormal muscle tone.     Gait: Gait normal.  Psychiatric:        Speech: Speech normal.        Behavior: Behavior normal.       Fall Risk: Fall Risk  02/28/2020 12/31/2019 08/08/2019 01/17/2019 10/04/2018  Falls in the past year? 0 0 0 0 0  Number falls in past yr: 0 0 0 0 -  Injury with Fall? 0 0 0 0 0  Follow up - - Falls evaluation completed Falls evaluation completed -    Functional Status Survey: Is the patient deaf or have difficulty hearing?: No Does the patient have difficulty seeing, even when wearing glasses/contacts?: No Does the patient have difficulty concentrating, remembering, or making decisions?: No Does the patient have difficulty walking or climbing stairs?: No Does the patient have difficulty dressing or bathing?: No Does the patient have difficulty doing errands alone such as visiting a doctor's office or shopping?: No   Assessment & Plan:   1. Gastroesophageal reflux disease without esophagitis Occasional symptoms she has not been able to get Protonix sent in to 2 different pharmacies today encourage her to follow-up if there is difficulty getting coverage but we did review online good Rx coupons for discount cash prices which are very affordable.  Coupons were printed for her if she wants to get it at [facility]. - pantoprazole (PROTONIX) 40 MG tablet; Take 1 tablet (40 mg total) by mouth daily.  Dispense: 90 tablet; Refill: 3  2. Hyperlipidemia,  unspecified hyperlipidemia type Medications have been changed about 1 year ago however there was not much improvement in her cholesterol, repeat labs, discussed possibly changing medications, encouraged her to continue to work on diet and lifestyle changes. She does not want to do labs today because she ate she will return for labs next week - COMPLETE METABOLIC PANEL WITH GFR - Lipid panel  3. Essential hypertension Stable, history of being well  controlled however she is just barely elevated above goal today she did have a little bit of weight gain over the winter months and with Covid pandemic she has been less active, encouraged healthy diet exercise and some small weight loss may significantly improve her hypertension.  Patient is motivated to get off medications. - COMPLETE METABOLIC PANEL WITH GFR - chlorthalidone (HYGROTON) 25 MG tablet; Take 0.5 tablets (12.5 mg total) by mouth daily.  Dispense: 90 tablet; Refill: 2  4. Encounter for medication monitoring - CBC with Differential/Platelet - COMPLETE METABOLIC PANEL WITH GFR - Lipid panel  5. Seasonal allergic rhinitis, unspecified trigger Patient request a refill of her seasonal allergy medications - fexofenadine (ALLEGRA) 180 MG tablet; Take 1 tablet (180 mg total) by mouth daily.  Dispense: 90 tablet; Refill: 1  6. 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  Dispense: 90 tablet; Refill: 2 - busPIRone (BUSPAR) 7.5 MG tablet; Take 1 tablet at night x7 days; then take twice daily x7 days; then may take 3rd dose as needed during the day  Dispense: 90 tablet; Refill: 2  7. Anxiety Patient continues to deal with a lot of stress and anxiety related to her job, GAD-7 screen is still positive but her mood overall is good, continue meds as is, encouraged her to use BuSpar as needed for more stressful weeks or when she is feeling more agitated - escitalopram (LEXAPRO) 20 MG tablet; Take 1 tablet (20 mg total) by mouth daily.  Dispense: 90 tablet; Refill: 2 - busPIRone (BUSPAR) 7.5 MG tablet; Take 1 tablet at night x7 days; then take twice daily x7 days; then may take 3rd dose as needed during the day  Dispense: 90 tablet; Refill: 2   Return for 6 months for labs.   Danelle Berry, PA-C 02/28/20 11:35 AM

## 2020-03-24 LAB — COMPLETE METABOLIC PANEL WITH GFR
AG Ratio: 1.7 (calc) (ref 1.0–2.5)
ALT: 19 U/L (ref 6–29)
AST: 20 U/L (ref 10–35)
Albumin: 4.1 g/dL (ref 3.6–5.1)
Alkaline phosphatase (APISO): 72 U/L (ref 37–153)
BUN: 17 mg/dL (ref 7–25)
CO2: 29 mmol/L (ref 20–32)
Calcium: 9.4 mg/dL (ref 8.6–10.4)
Chloride: 103 mmol/L (ref 98–110)
Creat: 0.96 mg/dL (ref 0.50–1.05)
GFR, Est African American: 79 mL/min/{1.73_m2} (ref >=60)
GFR, Est Non African American: 68 mL/min/{1.73_m2} (ref >=60)
Globulin: 2.4 g/dL (calc) (ref 1.9–3.7)
Glucose, Bld: 100 mg/dL — ABNORMAL HIGH (ref 65–99)
Potassium: 4.5 mmol/L (ref 3.5–5.3)
Sodium: 138 mmol/L (ref 135–146)
Total Bilirubin: 0.3 mg/dL (ref 0.2–1.2)
Total Protein: 6.5 g/dL (ref 6.1–8.1)

## 2020-03-24 LAB — CBC WITH DIFFERENTIAL/PLATELET
Absolute Monocytes: 503 cells/uL (ref 200–950)
Basophils Absolute: 61 cells/uL (ref 0–200)
Basophils Relative: 0.9 %
Eosinophils Absolute: 143 cells/uL (ref 15–500)
Eosinophils Relative: 2.1 %
HCT: 37.4 % (ref 35.0–45.0)
Hemoglobin: 12.5 g/dL (ref 11.7–15.5)
Lymphs Abs: 1938 cells/uL (ref 850–3900)
MCH: 29.9 pg (ref 27.0–33.0)
MCHC: 33.4 g/dL (ref 32.0–36.0)
MCV: 89.5 fL (ref 80.0–100.0)
MPV: 12.1 fL (ref 7.5–12.5)
Monocytes Relative: 7.4 %
Neutro Abs: 4155 cells/uL (ref 1500–7800)
Neutrophils Relative %: 61.1 %
Platelets: 197 10*3/uL (ref 140–400)
RBC: 4.18 10*6/uL (ref 3.80–5.10)
RDW: 12.3 % (ref 11.0–15.0)
Total Lymphocyte: 28.5 %
WBC: 6.8 10*3/uL (ref 3.8–10.8)

## 2020-03-24 LAB — LIPID PANEL
Cholesterol: 196 mg/dL (ref <200)
HDL: 64 mg/dL (ref >=50)
LDL Cholesterol (Calc): 109 mg/dL (calc) — ABNORMAL HIGH
Non-HDL Cholesterol (Calc): 132 mg/dL (calc) — ABNORMAL HIGH (ref <130)
Total CHOL/HDL Ratio: 3.1 (calc) (ref <5.0)
Triglycerides: 119 mg/dL (ref <150)

## 2020-08-28 ENCOUNTER — Ambulatory Visit: Payer: Commercial Managed Care - PPO | Admitting: Family Medicine

## 2020-09-17 ENCOUNTER — Ambulatory Visit: Payer: Commercial Managed Care - PPO | Admitting: Family Medicine

## 2020-09-18 ENCOUNTER — Other Ambulatory Visit: Payer: Self-pay

## 2020-09-18 ENCOUNTER — Ambulatory Visit (INDEPENDENT_AMBULATORY_CARE_PROVIDER_SITE_OTHER): Payer: Commercial Managed Care - PPO | Admitting: Emergency Medicine

## 2020-09-18 ENCOUNTER — Ambulatory Visit: Payer: Commercial Managed Care - PPO | Admitting: Family Medicine

## 2020-09-18 DIAGNOSIS — Z23 Encounter for immunization: Secondary | ICD-10-CM | POA: Diagnosis not present

## 2020-09-30 ENCOUNTER — Other Ambulatory Visit: Payer: Self-pay

## 2020-09-30 ENCOUNTER — Ambulatory Visit: Payer: Commercial Managed Care - PPO | Admitting: Family Medicine

## 2020-09-30 ENCOUNTER — Encounter: Payer: Self-pay | Admitting: Family Medicine

## 2020-09-30 VITALS — BP 130/80 | HR 84 | Temp 98.5°F | Resp 16 | Ht 65.0 in | Wt 178.1 lb

## 2020-09-30 DIAGNOSIS — J302 Other seasonal allergic rhinitis: Secondary | ICD-10-CM | POA: Diagnosis not present

## 2020-09-30 DIAGNOSIS — Z5181 Encounter for therapeutic drug level monitoring: Secondary | ICD-10-CM

## 2020-09-30 DIAGNOSIS — E785 Hyperlipidemia, unspecified: Secondary | ICD-10-CM | POA: Diagnosis not present

## 2020-09-30 DIAGNOSIS — I10 Essential (primary) hypertension: Secondary | ICD-10-CM | POA: Diagnosis not present

## 2020-09-30 DIAGNOSIS — F439 Reaction to severe stress, unspecified: Secondary | ICD-10-CM

## 2020-09-30 DIAGNOSIS — K219 Gastro-esophageal reflux disease without esophagitis: Secondary | ICD-10-CM

## 2020-09-30 DIAGNOSIS — F419 Anxiety disorder, unspecified: Secondary | ICD-10-CM

## 2020-09-30 DIAGNOSIS — E782 Mixed hyperlipidemia: Secondary | ICD-10-CM

## 2020-09-30 DIAGNOSIS — F321 Major depressive disorder, single episode, moderate: Secondary | ICD-10-CM

## 2020-09-30 MED ORDER — AZELASTINE-FLUTICASONE 137-50 MCG/ACT NA SUSP: 2.0000 | Freq: Every day | NASAL | 5 refills | Status: DC

## 2020-09-30 MED ORDER — FEXOFENADINE HCL 180 MG PO TABS: 180.0000 mg | ORAL_TABLET | Freq: Every day | ORAL | 1 refills | Status: DC

## 2020-09-30 MED ORDER — ATORVASTATIN CALCIUM 40 MG PO TABS: 40.0000 mg | ORAL_TABLET | Freq: Every day | ORAL | 3 refills | Status: DC

## 2020-09-30 NOTE — Patient Instructions (Signed)
Please request medication refills from the pharmacy - most of your meds still have another refill on them  Get another appointment if you want [place] to try and fill out and submit FMLA - it will need its own appointment to allow time to go through and complete all paper work with you. Doing the paperwork on our end does not guarantee that it will be approved.  I suggest reaching out to support or a therapist to help you cope with the stress from work, while you look for alternate employment.    Exercising and talk therapy are great healthy ways to cope with increased situational stress   Managing Anxiety, Adult After being diagnosed with an anxiety disorder, you may be relieved to know why you have felt or behaved a certain way. You may also feel overwhelmed about the treatment ahead and what it will mean for your life. With care and support, you can manage this condition and recover from it. How to manage lifestyle changes Managing stress and anxiety  Stress is your body's reaction to life changes and events, both good and bad. Most stress will last just a few hours, but stress can be ongoing and can lead to more than just stress. Although stress can play a major role in anxiety, it is not the same as anxiety. Stress is usually caused by something external, such as a deadline, test, or competition. Stress normally passes after the triggering event has ended.  Anxiety is caused by something internal, such as imagining a terrible outcome or worrying that something will go wrong that will devastate you. Anxiety often does not go away even after the triggering event is over, and it can become long-term (chronic) worry. It is important to understand the differences between stress and anxiety and to manage your stress effectively so that it does not lead to an anxious response. Talk with your health care provider or a counselor to learn more about reducing anxiety and stress. He or she may suggest tension  reduction techniques, such as:  Music therapy. This can include creating or listening to music that you enjoy and that inspires you.  Mindfulness-based meditation. This involves being aware of your normal breaths while not trying to control your breathing. It can be done while sitting or walking.  Centering prayer. This involves focusing on a word, phrase, or sacred image that means something to you and brings you peace.  Deep breathing. To do this, expand your stomach and inhale slowly through your nose. Hold your breath for 3-5 seconds. Then exhale slowly, letting your stomach muscles relax.  Self-talk. This involves identifying thought patterns that lead to anxiety reactions and changing those patterns.  Muscle relaxation. This involves tensing muscles and then relaxing them. Choose a tension reduction technique that suits your lifestyle and personality. These techniques take time and practice. Set aside 5-15 minutes a day to do them. Therapists can offer counseling and training in these techniques. The training to help with anxiety may be covered by some insurance plans. Other things you can do to manage stress and anxiety include:  Keeping a stress/anxiety diary. This can help you learn what triggers your reaction and then learn ways to manage your response.  Thinking about how you react to certain situations. You may not be able to control everything, but you can control your response.  Making time for activities that help you relax and not feeling guilty about spending your time in this way.  Visual imagery and  yoga can help you stay calm and relax.  Medicines Medicines can help ease symptoms. Medicines for anxiety include:  Anti-anxiety drugs.  Antidepressants. Medicines are often used as a primary treatment for anxiety disorder. Medicines will be prescribed by a health care provider. When used together, medicines, psychotherapy, and tension reduction techniques may be the most  effective treatment. Relationships Relationships can play a big part in helping you recover. Try to spend more time connecting with trusted friends and family members. Consider going to couples counseling, taking family education classes, or going to family therapy. Therapy can help you and others better understand your condition. How to recognize changes in your anxiety Everyone responds differently to treatment for anxiety. Recovery from anxiety happens when symptoms decrease and stop interfering with your daily activities at home or work. This may mean that you will start to:  Have better concentration and focus. Worry will interfere less in your daily thinking.  Sleep better.  Be less irritable.  Have more energy.  Have improved memory. It is important to recognize when your condition is getting worse. Contact your health care provider if your symptoms interfere with home or work and you feel like your condition is not improving. Follow these instructions at home: Activity  Exercise. Most adults should do the following: ? Exercise for at least 150 minutes each week. The exercise should increase your heart rate and make you sweat (moderate-intensity exercise). ? Strengthening exercises at least twice a week.  Get the right amount and quality of sleep. Most adults need 7-9 hours of sleep each night. Lifestyle   Eat a healthy diet that includes plenty of vegetables, fruits, whole grains, low-fat dairy products, and lean protein. Do not eat a lot of foods that are high in solid fats, added sugars, or salt.  Make choices that simplify your life.  Do not use any products that contain nicotine or tobacco, such as cigarettes, e-cigarettes, and chewing tobacco. If you need help quitting, ask your health care provider.  Avoid caffeine, alcohol, and certain over-the-counter cold medicines. These may make you feel worse. Ask your pharmacist which medicines to avoid. General  instructions  Take over-the-counter and prescription medicines only as told by your health care provider.  Keep all follow-up visits as told by your health care provider. This is important. Where to find support You can get help and support from these sources:  Self-help groups.  Online and [employer].  A trusted spiritual leader.  Couples counseling.  Family education classes.  Family therapy. Where to find more information You may find that joining a support group helps you deal with your anxiety. The following sources can help you locate counselors or support groups near you:  Mental Health America: www.mentalhealthamerica.net  Anxiety and Depression Association of [place] (ADAA): ProgramCam.de  [employer] Mental Illness (NAMI): www.nami.org Contact a health care provider if you:  Have a hard time staying focused or finishing daily tasks.  Spend many hours a day feeling worried about everyday life.  Become exhausted by worry.  Start to have headaches, feel tense, or have nausea.  Urinate more than normal.  Have diarrhea. Get help right away if you have:  A racing heart and shortness of breath.  Thoughts of hurting yourself or others. If you ever feel like you may hurt yourself or others, or have thoughts about taking your own life, get help right away. You can go to your nearest emergency department or call:  Your local emergency services (  911 in the [place]  A suicide crisis helpline, such as the National Suicide Prevention Lifeline at (747)495-9766. This is open 24 hours a day. Summary  Taking steps to learn and use tension reduction techniques can help calm you and help prevent triggering an anxiety reaction.  When used together, medicines, psychotherapy, and tension reduction techniques may be the most effective treatment.  Family, friends, and partners can play a big part in helping you recover from an anxiety disorder. This  information is not intended to replace advice given to you by your health care provider. Make sure you discuss any questions you have with your health care provider. Document Revised: 04/16/2019 Document Reviewed: 04/16/2019 Elsevier Patient Education  2020 [employer].

## 2020-09-30 NOTE — Progress Notes (Signed)
Name: Cassidy Collins   MRN: [id number]    DOB: 1965-12-02   Date:09/30/2020       Progress Note  Chief Complaint  Patient presents with  . Hyperlipidemia  . Hypertension  . Depression     Subjective:   Cassidy Collins is a 54 y.o. female, presents to clinic for routine f/up  Hyperlipidemia: Current Medication Regimen:  lipitor 40 mg daily, good med compliance no SE or concerns at this time Last Lipids: Lab Results  Component Value Date   CHOL 196 03/24/2020   HDL 64 03/24/2020   LDLCALC 109 (H) 03/24/2020   TRIG 119 03/24/2020   CHOLHDL 3.1 03/24/2020   The 10-year ASCVD risk score Denman George(Goff DC Jr., et al., 2013) is: 3.8%   Values used to calculate the score:     Age: [age] years     Sex: Female     Is Non-Hispanic African American: Yes     Diabetic: No     Tobacco smoker: No     Systolic Blood Pressure: 130 mmHg     Is BP treated: Yes     HDL Cholesterol: 64 mg/dL     Total Cholesterol: 196 mg/dL  - Denies: Chest pain, shortness of breath, myalgias. - Documented aortic atherosclerosis? No - Risk factors for atherosclerosis: hypercholesterolemia and hypertension  Hypertension:  Currently managed on chlorthalidone 12.5 mg, taking some days - not daily  She is worried about it hurting her kidneys, she pushes fluids when she takes it, does not have any other SE No SE.  denies lightheadedness, hypotension, syncope. Blood pressure today is at goal, well controlled BP Readings from Last 3 Encounters:  09/30/20 130/80  02/28/20 132/88  10/28/19 130/90  Pt denies CP, SOB, exertional sx, LE edema, palpitation, Ha's, visual disturbances Dietary efforts for BP?  Low salt.  GERD protonix 40 mg takes most days of the week, sx well controlled, sometimes skips on the weekends.  She feels immobility or stress from work is causing sx, cause she doesn't have on the weekends as much   Anxiety and depression:  Worse than in the past she is taking lexapro 20 mg and  buspar prn [facility] screen [facility] 09/30/2020 02/28/2020 12/31/2019  Decreased Interest 1 - 0  Down, Depressed, Hopeless 1 1 0  PHQ - 2 Score 2 1 0  Altered sleeping 2 0 0  Tired, decreased energy 2 0 0  Change in appetite 2 0 0  Feeling bad or failure about yourself  2 0 0  Trouble concentrating 2 1 0  Moving slowly or fidgety/restless 1 0 0  Suicidal thoughts 0 0 0  PHQ-9 Score 13 2 0  Difficult doing work/chores Somewhat difficult Somewhat difficult Not difficult at all  Some recent data might be hidden   GAD 7 : Generalized Anxiety Score 09/30/2020 01/17/2019 10/04/2018  Nervous, Anxious, on Edge 2 1 2   Control/stop worrying 3 2 1   Worry too much - different things 3 2 3   Trouble relaxing 2 0 2  Restless 1 2 1   Easily annoyed or irritable 2 2 3   Afraid - awful might happen 1 0 3  Total GAD 7 Score 14 9 15   Anxiety Difficulty Somewhat difficult Very difficult Somewhat difficult   Work stress is worsening - she was on the phone during visit talking to her HR about getting FMLA paperwork to give her mental health days.    Seasonal allergies - taking allegra and nasal  spray as needed   Current Outpatient Medications:  .  atorvastatin (LIPITOR) 40 MG tablet, Take 1 tablet (40 mg total) by mouth daily., Disp: 90 tablet, Rfl: 3 .  Azelastine-Fluticasone 137-50 MCG/ACT SUSP, Place 2 sprays into the nose daily., Disp: 23 g, Rfl: 5 .  busPIRone (BUSPAR) 7.5 MG tablet, Take 1 tablet at night x7 days; then take twice daily x7 days; then may take 3rd dose as needed during the day, Disp: 90 tablet, Rfl: 2 .  chlorthalidone (HYGROTON) 25 MG tablet, Take 0.5 tablets (12.5 mg total) by mouth daily., Disp: 90 tablet, Rfl: 2 .  escitalopram (LEXAPRO) 20 MG tablet, Take 1 tablet (20 mg total) by mouth daily., Disp: 90 tablet, Rfl: 2 .  fexofenadine (ALLEGRA) 180 MG tablet, Take 1 tablet (180 mg total) by mouth daily., Disp: 90 tablet, Rfl: 1 .  pantoprazole (PROTONIX) 40 MG tablet, Take once daily  as needed for heartburn/reflux symptoms., Disp: 90 tablet, Rfl: 1 .  pantoprazole (PROTONIX) 40 MG tablet, Take 1 tablet (40 mg total) by mouth daily., Disp: 90 tablet, Rfl: 3  Patient Active Problem List   Diagnosis Date Noted  . Early menopause 10/27/2019  . Current moderate episode of major depressive disorder without prior episode (HCC) 01/17/2019  . Anxiety 01/17/2019  . Essential hypertension 01/17/2019  . Family history of colon cancer   . Family history of ovarian cancer   . Hyperlipidemia 09/01/2017  . Overweight (BMI 25.0-29.9) 09/01/2017  . Other allergic rhinitis 09/01/2017  . Gastroesophageal reflux disease without esophagitis 09/01/2017    Past Surgical History:  Procedure Laterality Date  . COLONOSCOPY  2010   Dr Vanetta Shawl  . COLONOSCOPY W/ BIOPSIES  03/02/2018   hyperplastic polyp-repeat in 5 years/ Dr Norma Fredrickson  . DILATION AND CURETTAGE OF UTERUS    . 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 TUBAL LIGATION      Family History  Problem Relation Age of Onset  . Colon cancer Father 33  . Hypertension Father   . Breast cancer Maternal Aunt 69       has contact  . Ovarian cancer Maternal Grandmother 75  . Hypertension Mother   . Hypertension Sister     Social History   Tobacco Use  . Smoking status: Never Smoker  . Smokeless tobacco: Never Used  Vaping Use  . Vaping Use: Never used  Substance Use Topics  . Alcohol use: Yes    Alcohol/week: 0.0 standard drinks    Comment: occasionally on weekend  . Drug use: No     Allergies  Allergen Reactions  . Bee Venom     Health Maintenance  Topic Date Due  . Hepatitis C Screening  Never done  . MAMMOGRAM  10/27/2021  . COLONOSCOPY  03/03/2023  . PAP SMEAR-Modifier  10/17/2023  . TETANUS/TDAP  06/25/2028  . INFLUENZA VACCINE  Completed  . COVID-19 Vaccine  Completed  . HIV Screening  Completed    Chart Review Today: I personally reviewed active problem list, medication list, allergies, family  history, social history, health maintenance, notes from last encounter, lab results, imaging with the patient/caregiver today.   Review of Systems  10 Systems reviewed and are negative for acute change except as noted in the HPI.  Objective:   Vitals:   09/30/20 0948  BP: 130/80  Pulse: 84  Resp: 16  Temp: 98.5 F (36.9 C)  TempSrc: Oral  SpO2: 98%  Weight: 178 lb 1.6 oz (80.8 kg)  Height: 5\' 5"  (1.651 m)    Body mass index is 29.64 kg/m.  Physical Exam Vitals and nursing note reviewed.  Constitutional:      General: She is not in acute distress.    Appearance: Normal appearance. She is well-developed. She is not ill-appearing, toxic-appearing or diaphoretic.     Interventions: Face mask in place.  HENT:     Head: Normocephalic and atraumatic.     Right Ear: External ear normal.     Left Ear: External ear normal.  Eyes:     General: Lids are normal. No scleral icterus.       Right eye: No discharge.        Left eye: No discharge.     Conjunctiva/sclera: Conjunctivae normal.  Neck:     Trachea: Phonation normal. No tracheal deviation.  Cardiovascular:     Rate and Rhythm: Normal rate and regular rhythm.     Pulses: Normal pulses.          Radial pulses are 2+ on the right side and 2+ on the left side.     Heart sounds: Normal heart sounds. No murmur heard.  No friction rub. No gallop.   Pulmonary:     Effort: Pulmonary effort is normal. No respiratory distress.     Breath sounds: Normal breath sounds. No stridor. No wheezing, rhonchi or rales.  Chest:     Chest wall: No tenderness.  Abdominal:     General: Bowel sounds are normal. There is no distension.     Palpations: Abdomen is soft.  Musculoskeletal:     Right lower leg: No edema.     Left lower leg: No edema.  Skin:    General: Skin is warm and dry.     Coloration: Skin is not jaundiced or pale.     Findings: No rash.  Neurological:     Mental Status: She is alert.     Motor: No abnormal muscle  tone.     Gait: Gait normal.  Psychiatric:        Mood and Affect: Mood normal.        Speech: Speech normal.        Behavior: Behavior normal.         Assessment & Plan:   1. Essential hypertension Stable, well-controlled on chlorthalidone 12.5 mg Poor med compliance Encouraged her to take daily continue to work on diet, exercise, low-salt - COMPLETE METABOLIC PANEL WITH GFR  2. Gastroesophageal reflux disease without esophagitis Symptoms well controlled with Protonix  3. Hyperlipidemia, unspecified hyperlipidemia type Last lipid panel reviewed, compliant with medications no side effects or concerns - COMPLETE METABOLIC PANEL WITH GFR - atorvastatin (LIPITOR) 40 MG tablet; Take 1 tablet (40 mg total) by mouth daily.  Dispense: 90 tablet; Refill: 3  4. Seasonal allergic rhinitis, unspecified trigger Symptoms stable and well-controlled with as needed use of her allergy medicines - Azelastine-Fluticasone 137-50 MCG/ACT SUSP; Place 2 sprays into the nose daily.  Dispense: 23 g; Refill: 5 - fexofenadine (ALLEGRA) 180 MG tablet; Take 1 tablet (180 mg total) by mouth daily.  Dispense: 90 tablet; Refill: 1  5. Current moderate episode of major depressive disorder without prior episode (HCC) Worsening stress and anxiety with increased score on PHQ-9 and GAD-7, continue Lexapro 20 mg daily, encouraged her to work on healthy coping skills, find a therapist, or change her situation.  She is trying to get FMLA, encouraged her to bring paperwork back and get a subsequent follow-up visit  to complete paperwork.  I also advised her that completion paperwork may not get FMLA approved and she may require consultation with a psychiatrist, patient did not want to be referred to psychiatry today  6. Anxiety Worsening symptoms, mostly due to situational stress  7. Situational stress See #5 and 6  8. Encounter for medication monitoring - COMPLETE METABOLIC PANEL WITH GFR     Return in about  6 months (around 03/30/2021) for Routine follow-up, Annual Physical.   Danelle Berry, PA-C 09/30/20 10:01 AM

## 2020-10-01 LAB — COMPLETE METABOLIC PANEL WITH GFR
AG Ratio: 1.8 (calc) (ref 1.0–2.5)
ALT: 20 U/L (ref 6–29)
AST: 19 U/L (ref 10–35)
Albumin: 4.5 g/dL (ref 3.6–5.1)
Alkaline phosphatase (APISO): 89 U/L (ref 37–153)
BUN: 12 mg/dL (ref 7–25)
CO2: 31 mmol/L (ref 20–32)
Calcium: 9.9 mg/dL (ref 8.6–10.4)
Chloride: 100 mmol/L (ref 98–110)
Creat: 0.96 mg/dL (ref 0.50–1.05)
GFR, Est African American: 78 mL/min/{1.73_m2} (ref >=60)
GFR, Est Non African American: 68 mL/min/{1.73_m2} (ref >=60)
Globulin: 2.5 g/dL (calc) (ref 1.9–3.7)
Glucose, Bld: 86 mg/dL (ref 65–99)
Potassium: 4.1 mmol/L (ref 3.5–5.3)
Sodium: 138 mmol/L (ref 135–146)
Total Bilirubin: 0.5 mg/dL (ref 0.2–1.2)
Total Protein: 7 g/dL (ref 6.1–8.1)

## 2020-10-28 ENCOUNTER — Ambulatory Visit: Payer: Commercial Managed Care - PPO | Admitting: Obstetrics and Gynecology

## 2020-11-11 ENCOUNTER — Telehealth: Payer: Self-pay

## 2020-11-11 ENCOUNTER — Telehealth (INDEPENDENT_AMBULATORY_CARE_PROVIDER_SITE_OTHER): Payer: Commercial Managed Care - PPO | Admitting: Internal Medicine

## 2020-11-11 ENCOUNTER — Encounter: Payer: Self-pay | Admitting: Internal Medicine

## 2020-11-11 DIAGNOSIS — J0100 Acute maxillary sinusitis, unspecified: Secondary | ICD-10-CM | POA: Diagnosis not present

## 2020-11-11 DIAGNOSIS — U071 COVID-19: Secondary | ICD-10-CM | POA: Diagnosis not present

## 2020-11-11 MED ORDER — AMOXICILLIN-POT CLAVULANATE 875-125 MG PO TABS: 1.0000 | ORAL_TABLET | Freq: Two times a day (BID) | ORAL | 0 refills | Status: AC

## 2020-11-11 NOTE — Telephone Encounter (Signed)
Spoke with pt and scheduled her an appointment with Dr Dorris Fetch for today. Copied from CRM #[id number]. Topic: General - Other >> Nov 11, 2020  8:34 AM Jaquita Rector A wrote: Reason for CRM: Patient called in to inquire of Danelle Berry can she be prescribed an antibiotic Say that she was diagnosed with Covid on 10/24/20 but that her symptoms started on 10/20/20. Had her qurantine time but still having nasal issues. Say that she get a sinus infection every year and think that is what this is. Please advise can be reached at Ph# [phone]

## 2020-11-11 NOTE — Progress Notes (Signed)
Name: Cassidy Collins   MRN: [id number]    DOB: 1966/09/09   Date:11/11/2020       Progress Note  Subjective  Chief Complaint  Chief Complaint  Patient presents with  . Facial Pain    Sinus pressure. Just came out of quarantine.    I connected with  [name] on 11/11/20 at 10:20 AM EST by telephone and verified that I am speaking with the correct person using two identifiers.  I discussed the limitations, risks, security and privacy concerns of performing an evaluation and management service by telephone and the availability of in person appointments. The patient expressed understanding and agreed to proceed. Staff also discussed with the patient that there may be a patient responsible charge related to this service. Patient Location: Home Provider Location: [facility] Additional Individuals present: none  HPI Patient is a 54 year old female patient of Danelle Berry Last visit with her was 09/30/2020 Follows up today with the above complaints  Patient noted normally has a sinus infection yearly Dx'ed with Covid on 11/27, sx's started the 23rd, out of quarantine 12/11.  Noted has sinus pressure behind her eyes, face hurts down into her gums, hurts to touch, and ears feel full, and + HA started in last 6 days. Not have burning sensation in her nose had with Covid, went away a week into dx.  No fevers, minimal drainage Had cough and loss taste/smell with Covid and that is better. Had decreased energy with Covid and that is better.  Has tried allergy tablet, advil cold and sinus and not helping, also a tylenol sinus Tob - no history All - bee venom  She is very concerned is a sinus infection  Patient Active Problem List   Diagnosis Date Noted  . Early menopause 10/27/2019  . Current moderate episode of major depressive disorder without prior episode (HCC) 01/17/2019  . Anxiety 01/17/2019  . Essential hypertension 01/17/2019  . Family history of colon cancer   .  Family history of ovarian cancer   . Hyperlipidemia 09/01/2017  . Overweight (BMI 25.0-29.9) 09/01/2017  . Other allergic rhinitis 09/01/2017  . Gastroesophageal reflux disease without esophagitis 09/01/2017    Past Surgical History:  Procedure Laterality Date  . COLONOSCOPY  2010   Dr Vanetta Shawl  . COLONOSCOPY W/ BIOPSIES  03/02/2018   hyperplastic polyp-repeat in 5 years/ Dr Norma Fredrickson  . DILATION AND CURETTAGE OF UTERUS    . 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 TUBAL LIGATION      Family History  Problem Relation Age of Onset  . Colon cancer Father 36  . Hypertension Father   . Breast cancer Maternal Aunt 69       has contact  . Ovarian cancer Maternal Grandmother 75  . Hypertension Mother   . Hypertension Sister     Social History   Tobacco Use  . Smoking status: Never Smoker  . Smokeless tobacco: Never Used  Substance Use Topics  . Alcohol use: Yes    Alcohol/week: 0.0 standard drinks    Comment: occasionally on weekend     Current Outpatient Medications:  .  atorvastatin (LIPITOR) 40 MG tablet, Take 1 tablet (40 mg total) by mouth daily., Disp: 90 tablet, Rfl: 3 .  Azelastine-Fluticasone 137-50 MCG/ACT SUSP, Place 2 sprays into the nose daily., Disp: 23 g, Rfl: 5 .  busPIRone (BUSPAR) 7.5 MG tablet, Take 1 tablet at night x7 days; then take twice daily x7 days;  then may take 3rd dose as needed during the day, Disp: 90 tablet, Rfl: 2 .  chlorthalidone (HYGROTON) 25 MG tablet, Take 0.5 tablets (12.5 mg total) by mouth daily., Disp: 90 tablet, Rfl: 2 .  escitalopram (LEXAPRO) 20 MG tablet, Take 1 tablet (20 mg total) by mouth daily., Disp: 90 tablet, Rfl: 2 .  fexofenadine (ALLEGRA) 180 MG tablet, Take 1 tablet (180 mg total) by mouth daily., Disp: 90 tablet, Rfl: 1 .  pantoprazole (PROTONIX) 40 MG tablet, Take 1 tablet (40 mg total) by mouth daily., Disp: 90 tablet, Rfl: 3  Allergies  Allergen Reactions  . Bee Venom     With staff assistance, above  reviewed with the patient today.  ROS: As per HPI, otherwise no specific complaints on a limited and focused system review   Objective  Virtual encounter, vitals not obtained.  There is no height or weight on file to calculate BMI.  Physical Exam   Appears in NAD via conversation, has a slightly nasal voice, pleasant Breathing: No obvious respiratory distress. Speaking in complete sentences Neurological: Pt is alert,Speech is normal Psychiatric: Patient has a normal mood and affect, behavior is normal. Judgment and thought content normal.   No results found for this or any previous visit (from the past 72 hour(s)).  PHQ2/9: Depression screen [facility] 2/9 11/11/2020 09/30/2020 02/28/2020 12/31/2019 08/08/2019  Decreased Interest 0 1 - 0 0  Down, Depressed, Hopeless 1 1 1  0 1  PHQ - 2 Score 1 2 1  0 1  Altered sleeping 0 2 0 0 1  Tired, decreased energy 0 2 0 0 0  Change in appetite 0 2 0 0 2  Feeling bad or failure about yourself  0 2 0 0 1  Trouble concentrating 0 2 1 0 2  Moving slowly or fidgety/restless 0 1 0 0 0  Suicidal thoughts 0 0 0 0 0  PHQ-9 Score 1 13 2  0 7  Difficult doing work/chores Somewhat difficult Somewhat difficult Somewhat difficult Not difficult at all Somewhat difficult  Some recent data might be hidden   PHQ-2/9 Result reviewed  Fall Risk: Fall Risk  11/11/2020 09/30/2020 02/28/2020 12/31/2019 08/08/2019  Falls in the past year? 0 0 0 0 0  Number falls in past yr: 0 0 0 0 0  Injury with Fall? 0 0 0 0 0  Follow up - Falls evaluation completed - - Falls evaluation completed     Assessment & Plan 1. Acute maxillary sinusitis, recurrence not specified Noted concerns for a sinus infection with the persistent pain felt into her gums, and not relieved with the over-the-counter products to date. Felt best to proceed as follows: We will add in Augmentin product - amoxicillin-clavulanate (AUGMENTIN) 875-125 MG tablet; Take 1 tablet by mouth 2 (two) times daily for 7  days.  Dispense: 14 tablet; Refill: 0 Also recommended a Flonase product-can use twice daily for the first 3 days, then once daily after to help with inflammation and her symptoms. Think continuing ibuprofen or Tylenol product as needed for the discomfort.   2. COVID She recently had Covid, and notes her symptoms from that have significantly improved.  She should follow-up if symptoms not improving or more problematic despite the above recommendations  I discussed the assessment and treatment plan with the patient. The patient was provided an opportunity to ask questions and all were answered. The patient agreed with the plan and demonstrated an understanding of the instructions.   The patient was advised to  call back or seek an in-person evaluation if the symptoms worsen or if the condition fails to improve as anticipated.  I provided 20 minutes of non-face-to-face time during this encounter that included discussing at length patient's sx/history, pertinent pmhx, medications, treatment and follow up plan. This time also included the necessary documentation, orders, and chart review.  Jamelle Haring, MD

## 2020-11-11 NOTE — Telephone Encounter (Signed)
Patient can make virtual with Cassidy Collins to discuss issues

## 2020-12-08 NOTE — Progress Notes (Signed)
PCP: Delsa Grana, PA-C   Chief Complaint  Patient presents with  . Gynecologic Exam    No concerns    HPI:      Cassidy Collins is a 55 y.o. G1P1001 who LMP was No LMP recorded. Patient is postmenopausal., presents today for her annual examination.  Her menses are absent due to premature menopause (age 55). She does not have PMB. She has rare vasomotor sx.   Sex activity: single partner, contraception - post menopausal status. She does not have vaginal dryness.  Last Pap: 10/16/18  Results were: no abnormalities /neg HPV DNA.  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  Last mammogram: 10/28/19 Results were: normal--routine follow-up in 12 months.  There is a FH of breast cancer in her mat aunt and ovarian cancer in her MGM. Pt's sister is BRCA neg. Pt has declined genetic testing several times in past. The patient does do self-breast exams.  Colonoscopy: 2019 Repeat due after 5 years. Father with colon cancer.  DEXA: never  Tobacco use: The patient denies current or previous tobacco use. Alcohol use: social drinker  No drug use. Exercise: moderately active  She does get adequate calcium and Vitamin D in her diet.  Labs with PCP.   Past Medical History:  Diagnosis Date  . Anxiety 2019  . Family history of colon cancer   . Family history of ovarian cancer   . GERD (gastroesophageal reflux disease)   . History of abnormal cervical Pap smear    positive HRHPV  . History of mammogram 10/04/2016   neg  . Hyperlipidemia     Past Surgical History:  Procedure Laterality Date  . COLONOSCOPY  2010   Dr Adair Laundry  . COLONOSCOPY W/ BIOPSIES  03/02/2018   hyperplastic polyp-repeat in 5 years/ Dr Alice Reichert  . DILATION AND CURETTAGE OF UTERUS    . 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 TUBAL LIGATION      Family History  Problem Relation Age of Onset  . Colon cancer Father 52  . Hypertension Father   .  Breast cancer Maternal Aunt 69       has contact  . Ovarian cancer Maternal Grandmother 75  . Hypertension Mother   . Hypertension Sister     Social History   Socioeconomic History  . Marital status: Married    Spouse name: Not on file  . Number of children: 1  . Years of education: Not on file  . Highest education level: Not on file  Occupational History  . Occupation: Chiropodist  Tobacco Use  . Smoking status: Never Smoker  . Smokeless tobacco: Never Used  Vaping Use  . Vaping Use: Never used  Substance and Sexual Activity  . Alcohol use: Yes    Alcohol/week: 0.0 standard drinks    Comment: occasionally on weekend  . Drug use: No  . Sexual activity: Yes    Partners: Male    Birth control/protection: Post-menopausal  Other Topics Concern  . Not on file  Social History Narrative  . Not on file   Social Determinants of Health   Financial Resource Strain: Not on file  Food Insecurity: Not on file  Transportation Needs: Not on file  Physical Activity: Not on file  Stress: Not on file  Social Connections: Not on file  Intimate Partner Violence: Not on file    Outpatient Medications Prior  to Visit  Medication Sig Dispense Refill  . atorvastatin (LIPITOR) 40 MG tablet Take 1 tablet (40 mg total) by mouth daily. 90 tablet 3  . Azelastine-Fluticasone 137-50 MCG/ACT SUSP Place 2 sprays into the nose daily. 23 g 5  . busPIRone (BUSPAR) 7.5 MG tablet Take 1 tablet at night x7 days; then take twice daily x7 days; then may take 3rd dose as needed during the day 90 tablet 2  . chlorthalidone (HYGROTON) 25 MG tablet Take 0.5 tablets (12.5 mg total) by mouth daily. 90 tablet 2  . escitalopram (LEXAPRO) 20 MG tablet Take 1 tablet (20 mg total) by mouth daily. 90 tablet 2  . fexofenadine (ALLEGRA) 180 MG tablet Take 1 tablet (180 mg total) by mouth daily. 90 tablet 1  . pantoprazole (PROTONIX) 40 MG tablet Take 1 tablet (40 mg total) by mouth daily. 90 tablet 3   No  facility-administered medications prior to visit.       ROS:  Review of Systems  Constitutional: Negative for fatigue, fever and unexpected weight change.  Respiratory: Negative for cough, shortness of breath and wheezing.   Cardiovascular: Negative for chest pain, palpitations and leg swelling.  Gastrointestinal: Negative for blood in stool, constipation, diarrhea, nausea and vomiting.  Endocrine: Negative for cold intolerance, heat intolerance and polyuria.  Genitourinary: Negative for dyspareunia, dysuria, flank pain, frequency, genital sores, hematuria, menstrual problem, pelvic pain, urgency, vaginal bleeding, vaginal discharge and vaginal pain.  Musculoskeletal: Negative for back pain, joint swelling and myalgias.  Skin: Negative for rash.  Neurological: Negative for dizziness, syncope, light-headedness, numbness and headaches.  Hematological: Negative for adenopathy.  Psychiatric/Behavioral: Negative for agitation, confusion, sleep disturbance and suicidal ideas. The patient is not nervous/anxious.   BREAST: No symptoms    Objective: BP 120/90   Ht _0  (1.651 m)   Wt 179 lb (81.2 kg)   BMI 29.79 kg/m    Physical Exam Constitutional:      Appearance: She is well-developed.  Genitourinary:     Vulva normal.     Right Labia: No rash, tenderness or lesions.    Left Labia: No tenderness, lesions or rash.    No vaginal discharge, erythema or tenderness.      Right Adnexa: not tender and no mass present.    Left Adnexa: not tender and no mass present.    No cervical motion tenderness, friability or polyp.     Uterus is not enlarged or tender.  Breasts:     Right: No mass, nipple discharge, skin change or tenderness.     Left: No mass, nipple discharge, skin change or tenderness.    Neck:     Thyroid: No thyromegaly.  Cardiovascular:     Rate and Rhythm: Normal rate and regular rhythm.     Heart sounds: Normal heart sounds. No murmur heard.   Pulmonary:      Effort: Pulmonary effort is normal.     Breath sounds: Normal breath sounds.  Abdominal:     Palpations: Abdomen is soft.     Tenderness: There is no abdominal tenderness. There is no guarding or rebound.  Musculoskeletal:        General: Normal range of motion.     Cervical back: Normal range of motion.  Lymphadenopathy:     Cervical: No cervical adenopathy.  Neurological:     General: No focal deficit present.     Mental Status: She is alert and oriented to person, place, and time.     Cranial  Nerves: No cranial nerve deficit.  Skin:    General: Skin is warm and dry.  Psychiatric:        Mood and Affect: Mood normal.        Behavior: Behavior normal.        Thought Content: Thought content normal.        Judgment: Judgment normal.  Vitals reviewed.     Assessment/Plan: Encounter for annual routine gynecological examination  Encounter for screening mammogram for malignant neoplasm of breast - Plan: MM 3D SCREEN BREAST BILATERAL; pt to sched mammo  Early menopause--pt not on ERT.   Family history of ovarian cancer--MyRisk testing discussed and pt declines. F/u prn.   Screening for osteoporosis--DEXA recommended due to early menopause. Pt will call back this spring to order because doesn't want to do any tests with covid currently. Cont Ca/Vit D/exercise.          GYN counsel breast self exam, mammography screening, menopause, adequate intake of calcium and vitamin D, diet and exercise    F/U  Return in about 1 year (around 12/10/2021).  Rodricus Candelaria B. Emeline Simpson, PA-C 12/10/2020 9:10 AM

## 2020-12-10 ENCOUNTER — Ambulatory Visit (INDEPENDENT_AMBULATORY_CARE_PROVIDER_SITE_OTHER): Payer: Commercial Managed Care - PPO | Admitting: Obstetrics and Gynecology

## 2020-12-10 ENCOUNTER — Encounter: Payer: Self-pay | Admitting: Obstetrics and Gynecology

## 2020-12-10 ENCOUNTER — Other Ambulatory Visit: Payer: Self-pay

## 2020-12-10 VITALS — BP 120/90 | Ht 65.0 in | Wt 179.0 lb

## 2020-12-10 DIAGNOSIS — Z8041 Family history of malignant neoplasm of ovary: Secondary | ICD-10-CM

## 2020-12-10 DIAGNOSIS — E28319 Asymptomatic premature menopause: Secondary | ICD-10-CM

## 2020-12-10 DIAGNOSIS — Z13820 Encounter for screening for osteoporosis: Secondary | ICD-10-CM

## 2020-12-10 DIAGNOSIS — Z1231 Encounter for screening mammogram for malignant neoplasm of breast: Secondary | ICD-10-CM | POA: Diagnosis not present

## 2020-12-10 DIAGNOSIS — Z01419 Encounter for gynecological examination (general) (routine) without abnormal findings: Secondary | ICD-10-CM

## 2020-12-10 NOTE — Patient Instructions (Signed)
I value your feedback and you entrusting us with your care. If you get a [facility] patient survey, I would appreciate you taking the time to let us know about your experience today. Thank you!  [facility]: [phone]      

## 2021-01-14 ENCOUNTER — Other Ambulatory Visit: Payer: Self-pay

## 2021-01-14 ENCOUNTER — Other Ambulatory Visit: Payer: Self-pay | Admitting: Obstetrics and Gynecology

## 2021-01-14 ENCOUNTER — Ambulatory Visit
Admission: RE | Admit: 2021-01-14 | Discharge: 2021-01-14 | Disposition: A | Discharge status: Home / Self Care | Payer: Commercial Managed Care - PPO | Source: Ambulatory Visit | Attending: Obstetrics and Gynecology | Admitting: Obstetrics and Gynecology

## 2021-01-14 DIAGNOSIS — Z1231 Encounter for screening mammogram for malignant neoplasm of breast: Secondary | ICD-10-CM | POA: Insufficient documentation

## 2021-03-02 ENCOUNTER — Other Ambulatory Visit: Payer: Self-pay

## 2021-03-02 DIAGNOSIS — I10 Essential (primary) hypertension: Secondary | ICD-10-CM

## 2021-03-04 MED ORDER — CHLORTHALIDONE 25 MG PO TABS: 12.5000 mg | ORAL_TABLET | Freq: Every day | ORAL | 3 refills | Status: DC

## 2021-03-16 ENCOUNTER — Other Ambulatory Visit: Payer: Self-pay | Admitting: Family Medicine

## 2021-03-16 DIAGNOSIS — K219 Gastro-esophageal reflux disease without esophagitis: Secondary | ICD-10-CM

## 2021-03-16 MED ORDER — PANTOPRAZOLE SODIUM 40 MG PO TBEC: 40.0000 mg | DELAYED_RELEASE_TABLET | Freq: Every day | ORAL | 0 refills | Status: DC

## 2021-03-16 NOTE — Telephone Encounter (Signed)
Copied from CRM [id number]. Topic: Quick Communication - Rx Refill/Question >> Mar 16, 2021 11:03 AM Gaetana Michaelis A wrote: Medication: pantoprazole (PROTONIX) 40 MG tablet  Has the patient contacted their pharmacy? Patient has contacted their pharmacy and been directed to contact their PCP  Preferred Pharmacy (with phone number or street name): [facility] [address] - [place], [place] - [address]  Phone:  [phone] Fax:  [phone]  Agent: Please be advised that RX refills may take up to 3 business days. We ask that you follow-up with your pharmacy.

## 2021-03-31 ENCOUNTER — Ambulatory Visit: Payer: Commercial Managed Care - PPO | Admitting: Family Medicine

## 2021-04-22 ENCOUNTER — Ambulatory Visit: Payer: Commercial Managed Care - PPO | Admitting: Family Medicine

## 2021-04-29 ENCOUNTER — Ambulatory Visit: Payer: Commercial Managed Care - PPO | Admitting: Unknown Physician Specialty

## 2021-04-29 ENCOUNTER — Other Ambulatory Visit: Payer: Self-pay

## 2021-04-29 ENCOUNTER — Encounter: Payer: Self-pay | Admitting: Unknown Physician Specialty

## 2021-04-29 DIAGNOSIS — E782 Mixed hyperlipidemia: Secondary | ICD-10-CM

## 2021-04-29 DIAGNOSIS — K219 Gastro-esophageal reflux disease without esophagitis: Secondary | ICD-10-CM

## 2021-04-29 DIAGNOSIS — F321 Major depressive disorder, single episode, moderate: Secondary | ICD-10-CM | POA: Diagnosis not present

## 2021-04-29 DIAGNOSIS — I10 Essential (primary) hypertension: Secondary | ICD-10-CM

## 2021-04-29 DIAGNOSIS — J3089 Other allergic rhinitis: Secondary | ICD-10-CM

## 2021-04-29 DIAGNOSIS — F419 Anxiety disorder, unspecified: Secondary | ICD-10-CM

## 2021-04-29 DIAGNOSIS — F32A Depression, unspecified: Secondary | ICD-10-CM

## 2021-04-29 DIAGNOSIS — E663 Overweight: Secondary | ICD-10-CM

## 2021-04-29 DIAGNOSIS — E785 Hyperlipidemia, unspecified: Secondary | ICD-10-CM

## 2021-04-29 DIAGNOSIS — J302 Other seasonal allergic rhinitis: Secondary | ICD-10-CM

## 2021-04-29 MED ORDER — ATORVASTATIN CALCIUM 40 MG PO TABS: 40.0000 mg | ORAL_TABLET | Freq: Every day | ORAL | 3 refills | Status: DC

## 2021-04-29 MED ORDER — FEXOFENADINE HCL 180 MG PO TABS: 180.0000 mg | ORAL_TABLET | Freq: Every day | ORAL | 1 refills | Status: DC

## 2021-04-29 MED ORDER — CHLORTHALIDONE 25 MG PO TABS: 12.5000 mg | ORAL_TABLET | Freq: Every day | ORAL | 3 refills | Status: DC

## 2021-04-29 MED ORDER — OMEPRAZOLE 40 MG PO CPDR: 40.0000 mg | DELAYED_RELEASE_CAPSULE | Freq: Every day | ORAL | 3 refills | Status: DC

## 2021-04-29 MED ORDER — ESCITALOPRAM OXALATE 20 MG PO TABS: 20.0000 mg | ORAL_TABLET | Freq: Every day | ORAL | 2 refills | Status: DC

## 2021-04-29 MED ORDER — BUSPIRONE HCL 7.5 MG PO TABS: ORAL_TABLET | ORAL | 2 refills | Status: DC

## 2021-04-29 MED ORDER — AZELASTINE-FLUTICASONE 137-50 MCG/ACT NA SUSP: 2.0000 | Freq: Every day | NASAL | 5 refills | Status: DC

## 2021-04-29 NOTE — Assessment & Plan Note (Signed)
Stable, continue present medications.   

## 2021-04-29 NOTE — Assessment & Plan Note (Signed)
Buspar working well.  Continue current medications

## 2021-04-29 NOTE — Assessment & Plan Note (Signed)
Discussed stress and recommended counseling.  Continue Lexapro

## 2021-04-29 NOTE — Assessment & Plan Note (Signed)
Switch to Protonix with pt request

## 2021-04-29 NOTE — Progress Notes (Signed)
BP 124/76   Pulse 98   Temp 98.1 F (36.7 C) (Oral)   Resp 16   Ht 5\' 5"  (1.651 m)   Wt 173 lb 11.2 oz (78.8 kg)   SpO2 98%   BMI 28.91 kg/m    Subjective:    Patient ID: , female    DOB: 26-Jan-1966, 55 y.o.   MRN: [id number]  HPI: Cassidy Collins is a 55 y.o. female  Chief Complaint  Patient presents with  . Hypertension  . Depression  . Hyperlipidemia    Follow up  . Gastroesophageal Reflux    Would like Omeprazole in place    Hypertension Using medications without difficulty Average home BPs with normal BP meds   No problems or lightheadedness No chest pain with exertion or shortness of breath No Edema   Hyperlipidemia Using medications without problems: No Muscle aches  Diet compliance: Working on losing weight Exercise: More exercise at work  Reflux Taking Protonix.  Took a friend's Omeprazole which worked better and would like to switch.  Takes it prn   Depression: Job gets on her nerves.  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  Talks to a pastor which helps  Depression screen [facility] 2/9 04/29/2021 11/11/2020 09/30/2020 02/28/2020 12/31/2019  Decreased Interest 2 0 1 - 0  Down, Depressed, Hopeless 2 1 1 1  0  PHQ - 2 Score 4 1 2 1  0  Altered sleeping 2 0 2 0 0  Tired, decreased energy 1 0 2 0 0  Change in appetite 0 0 2 0 0  Feeling bad or failure about yourself  1 0 2 0 0  Trouble concentrating 1 0 2 1 0  Moving slowly or fidgety/restless 2 0 1 0 0  Suicidal thoughts 0 0 0 0 0  PHQ-9 Score 11 1 13 2  0  Difficult doing work/chores Somewhat difficult Somewhat difficult Somewhat difficult Somewhat difficult Not difficult at all  Some recent data might be hidden   GAD 7 : Generalized Anxiety Score 04/29/2021 09/30/2020 01/17/2019 10/04/2018  Nervous, Anxious, on Edge 1 2 1 2   Control/stop worrying 2 3 2 1   Worry too much - different things 2 3 2 3   Trouble relaxing 2 2 0 2  Restless 2 1 2 1   Easily annoyed or irritable 1 2 2 3   Afraid - awful  might happen 1 1 0 3  Total GAD 7 Score 11 14 9 15   Anxiety Difficulty Somewhat difficult Somewhat difficult Very difficult Somewhat difficult     Relevant past medical, surgical, family and social history reviewed and updated as indicated. Interim medical history since our last visit reviewed. Allergies and medications reviewed and updated.  Review of Systems  Per HPI unless specifically indicated above     Objective:    BP 124/76   Pulse 98   Temp 98.1 F (36.7 C) (Oral)   Resp 16   Ht 5\' 5"  (1.651 m)   Wt 173 lb 11.2 oz (78.8 kg)   SpO2 98%   BMI 28.91 kg/m   Wt Readings from Last 3 Encounters:  04/29/21 173 lb 11.2 oz (78.8 kg)  12/10/20 179 lb (81.2 kg)  09/30/20 178 lb 1.6 oz (80.8 kg)    Physical Exam Constitutional:      General: She is not in acute distress.    Appearance: Normal appearance. She is well-developed.  HENT:     Head: Normocephalic and atraumatic.  Eyes:     General:  Lids are normal. No scleral icterus.       Right eye: No discharge.        Left eye: No discharge.     Conjunctiva/sclera: Conjunctivae normal.  Neck:     Vascular: No carotid bruit or JVD.  Cardiovascular:     Rate and Rhythm: Normal rate and regular rhythm.     Heart sounds: Normal heart sounds.  Pulmonary:     Effort: Pulmonary effort is normal.     Breath sounds: Normal breath sounds.  Abdominal:     Palpations: There is no hepatomegaly or splenomegaly.  Musculoskeletal:        General: Normal range of motion.     Cervical back: Normal range of motion and neck supple.  Skin:    General: Skin is warm and dry.     Coloration: Skin is not pale.     Findings: No rash.  Neurological:     Mental Status: She is alert and oriented to person, place, and time.  Psychiatric:        Behavior: Behavior normal.        Thought Content: Thought content normal.        Judgment: Judgment normal.     Results for orders placed or performed in visit on 09/30/20  COMPLETE METABOLIC  PANEL WITH GFR  Result Value Ref Range   Glucose, Bld 86 65 - 99 mg/dL   BUN 12 7 - 25 mg/dL   Creat 1.00 7.12 - 1.97 mg/dL   GFR, Est Non African American 68 > OR = 60 mL/min/1.92m2   GFR, Est African American 78 > OR = 60 mL/min/1.16m2   BUN/Creatinine Ratio NOT APPLICABLE 6 - 22 (calc)   Sodium 138 135 - 146 mmol/L   Potassium 4.1 3.5 - 5.3 mmol/L   Chloride 100 98 - 110 mmol/L   CO2 31 20 - 32 mmol/L   Calcium 9.9 8.6 - 10.4 mg/dL   Total Protein 7.0 6.1 - 8.1 g/dL   Albumin 4.5 3.6 - 5.1 g/dL   Globulin 2.5 1.9 - 3.7 g/dL (calc)   AG Ratio 1.8 1.0 - 2.5 (calc)   Total Bilirubin 0.5 0.2 - 1.2 mg/dL   Alkaline phosphatase (APISO) 89 37 - 153 U/L   AST 19 10 - 35 U/L   ALT 20 6 - 29 U/L      Assessment & Plan:   Problem List Items Addressed This Visit      Unprioritized   Anxiety    Buspar working well.  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  Having a lot of stress.  Encouraged continued counseling      Relevant Medications   escitalopram (LEXAPRO) 20 MG tablet   busPIRone (BUSPAR) 7.5 MG tablet   RESOLVED: Depression    Discussed stress and recommended counseling.  Continue Lexapro      Relevant Medications   escitalopram (LEXAPRO) 20 MG tablet   busPIRone (BUSPAR) 7.5 MG tablet   Essential hypertension    Stable, continue present medications.        Relevant Medications   atorvastatin (LIPITOR) 40 MG tablet   chlorthalidone (HYGROTON) 25 MG tablet   Other Relevant Orders   Comprehensive metabolic panel   Gastroesophageal reflux disease without esophagitis    Switch  to Protonix with pt request      Relevant Medications   omeprazole (PRILOSEC) 40 MG capsule   Hyperlipidemia    Check lipid panel today.  Continue current medications      Relevant  Medications   atorvastatin (LIPITOR) 40 MG tablet   chlorthalidone (HYGROTON) 25 MG tablet   Other Relevant Orders   Lipid panel   Other allergic rhinitis    Feels she needs to continue her allergy medications.  Refill at this time      Overweight (BMI 25.0-29.9)    Lost 6 pounds from last visit       Other Visit Diagnoses    Seasonal allergic rhinitis, unspecified trigger       Relevant Medications   Azelastine-Fluticasone 137-50 MCG/ACT SUSP   fexofenadine (ALLEGRA) 180 MG tablet       Follow up plan: Return in about 6 months (around 10/29/2021).

## 2021-04-29 NOTE — Assessment & Plan Note (Signed)
Continue Lexapro.  Having a lot of stress.  Encouraged continued counseling

## 2021-04-29 NOTE — Assessment & Plan Note (Signed)
Lost 6 pounds from last visit

## 2021-04-29 NOTE — Assessment & Plan Note (Signed)
Check lipid panel today.  Continue current medications

## 2021-04-29 NOTE — Assessment & Plan Note (Signed)
Feels she needs to continue her allergy medications.  Refill at this time

## 2021-04-30 LAB — COMPREHENSIVE METABOLIC PANEL
AG Ratio: 1.8 (calc) (ref 1.0–2.5)
ALT: 20 U/L (ref 6–29)
AST: 18 U/L (ref 10–35)
Albumin: 4.6 g/dL (ref 3.6–5.1)
Alkaline phosphatase (APISO): 81 U/L (ref 37–153)
BUN: 17 mg/dL (ref 7–25)
CO2: 30 mmol/L (ref 20–32)
Calcium: 9.7 mg/dL (ref 8.6–10.4)
Chloride: 101 mmol/L (ref 98–110)
Creat: 1.01 mg/dL (ref 0.50–1.05)
Globulin: 2.6 g/dL (calc) (ref 1.9–3.7)
Glucose, Bld: 88 mg/dL (ref 65–99)
Potassium: 4.2 mmol/L (ref 3.5–5.3)
Sodium: 140 mmol/L (ref 135–146)
Total Bilirubin: 0.4 mg/dL (ref 0.2–1.2)
Total Protein: 7.2 g/dL (ref 6.1–8.1)

## 2021-04-30 LAB — LIPID PANEL
Cholesterol: 189 mg/dL (ref <200)
HDL: 66 mg/dL (ref >=50)
LDL Cholesterol (Calc): 97 mg/dL (calc)
Non-HDL Cholesterol (Calc): 123 mg/dL (calc) (ref <130)
Total CHOL/HDL Ratio: 2.9 (calc) (ref <5.0)
Triglycerides: 159 mg/dL — ABNORMAL HIGH (ref <150)

## 2021-06-17 ENCOUNTER — Other Ambulatory Visit: Payer: Self-pay | Admitting: Family Medicine

## 2021-06-17 DIAGNOSIS — J302 Other seasonal allergic rhinitis: Secondary | ICD-10-CM

## 2021-08-25 ENCOUNTER — Telehealth: Payer: Self-pay

## 2021-08-25 NOTE — Telephone Encounter (Signed)
Copied from CRM [id number]. Topic: General - Other >> Aug 25, 2021  2:24 PM Jaquita Rector A wrote: Reason for CRM: Patient called in asking for Myriam Jacobson to please give her a call back ASAP please this is in reference to her FMLA paper work that have been completed incorrectly. Please call patient at  Ph# [phone]

## 2021-08-30 NOTE — Telephone Encounter (Signed)
Patient to come in on 10/6 for flu vaccine and to pick up paperwork after signed by Elnita Maxwell NP

## 2021-09-02 ENCOUNTER — Ambulatory Visit (INDEPENDENT_AMBULATORY_CARE_PROVIDER_SITE_OTHER): Payer: Commercial Managed Care - PPO | Admitting: Emergency Medicine

## 2021-09-02 DIAGNOSIS — Z23 Encounter for immunization: Secondary | ICD-10-CM | POA: Diagnosis not present

## 2021-10-01 ENCOUNTER — Ambulatory Visit: Payer: Commercial Managed Care - PPO | Admitting: Family Medicine

## 2021-10-01 ENCOUNTER — Encounter: Payer: Self-pay | Admitting: Family Medicine

## 2021-10-01 ENCOUNTER — Other Ambulatory Visit: Payer: Self-pay

## 2021-10-01 VITALS — BP 142/96 | HR 94 | Temp 98.3°F | Resp 16 | Ht 65.0 in | Wt 174.9 lb

## 2021-10-01 DIAGNOSIS — F321 Major depressive disorder, single episode, moderate: Secondary | ICD-10-CM

## 2021-10-01 DIAGNOSIS — Z0289 Encounter for other administrative examinations: Secondary | ICD-10-CM

## 2021-10-01 DIAGNOSIS — I10 Essential (primary) hypertension: Secondary | ICD-10-CM | POA: Diagnosis not present

## 2021-10-01 NOTE — Progress Notes (Signed)
Patient ID: Cassidy Collins, female    DOB: [birth year], 55 y.o.   MRN: [id number]  PCP: Danelle Berry, PA-C  Chief Complaint  Patient presents with   FMLA paperwork    Subjective:   Cassidy Collins is a 55 y.o. female, presents to clinic with CC of the following:  HPI  Pt presents for problem with FMLA paperwork - previously completed Nov 2021 with stipulation that pt was referred to psychiatry for eval/tx and further recommendations.  Dates went through 09/2021, and there was also some paperwork redone in April 2022   MDD managed on lexapro 20 mg and prn Buspar- stressed and anxious/irritated today due to this situation - BP high, reviewed PHQ and GAD7 today Depression screen [facility] 2/9 10/01/2021 04/29/2021 11/11/2020  Decreased Interest 3 2 0  Down, Depressed, Hopeless 3 2 1   PHQ - 2 Score 6 4 1   Altered sleeping 0 2 0  Tired, decreased energy 1 1 0  Change in appetite 0 0 0  Feeling bad or failure about yourself  1 1 0  Trouble concentrating 0 1 0  Moving slowly or fidgety/restless 0 2 0  Suicidal thoughts 0 0 0  PHQ-9 Score 8 11 1   Difficult doing work/chores Somewhat difficult Somewhat difficult Somewhat difficult  Some recent data might be hidden   GAD 7 : Generalized Anxiety Score 10/01/2021 04/29/2021 09/30/2020 01/17/2019  Nervous, Anxious, on Edge 1 1 2 1   Control/stop worrying 3 2 3 2   Worry too much - different things 3 2 3 2   Trouble relaxing 1 2 2  0  Restless 0 2 1 2   Easily annoyed or irritable 3 1 2 2   Afraid - awful might happen 0 1 1 0  Total GAD 7 Score 11 11 14 9   Anxiety Difficulty Somewhat difficult Somewhat difficult Somewhat difficult Very difficult   Not doing therapy right now All FMLA paperwork in chart from the past 1-2 years since I have seen her was reviewed today  Hypertension:  BP high today, she refused recheck Currently managed on chlorthalidone 25 mg Pt reports good med compliance and denies any SE.   BP Readings from Last 3  Encounters:  10/01/21 (!) 142/96  04/29/21 124/76  12/10/20 120/90   Pt denies CP, SOB, exertional sx, LE edema, palpitation, Ha's, visual disturbances, lightheadedness, hypotension, syncope.      Patient Active Problem List   Diagnosis Date Noted   COVID 11/11/2020   Early menopause 10/27/2019   Current moderate episode of major depressive disorder without prior episode (HCC) 01/17/2019   Anxiety 01/17/2019   Essential hypertension 01/17/2019   Family history of colon cancer    Family history of ovarian cancer    Hyperlipidemia 09/01/2017   Overweight (BMI 25.0-29.9) 09/01/2017   Other allergic rhinitis 09/01/2017   Gastroesophageal reflux disease without esophagitis 09/01/2017      Current Outpatient Medications:    atorvastatin (LIPITOR) 40 MG tablet, Take 1 tablet (40 mg total) by mouth daily., Disp: 90 tablet, Rfl: 3   Azelastine-Fluticasone 137-50 MCG/ACT SUSP, Place 2 sprays into the nose daily., Disp: 23 g, Rfl: 5   busPIRone (BUSPAR) 7.5 MG tablet, Take 1 tablet at night x7 days; then take twice daily x7 days; then may take 3rd dose as needed during the day, Disp: 90 tablet, Rfl: 2   chlorthalidone (HYGROTON) 25 MG tablet, Take 0.5 tablets (12.5 mg total) by mouth daily., Disp: 45 tablet, Rfl: 3   escitalopram (  LEXAPRO) 20 MG tablet, Take 1 tablet (20 mg total) by mouth daily., Disp: 90 tablet, Rfl: 2   fexofenadine (ALLEGRA) 180 MG tablet, TAKE 1 TABLET(180 MG) BY MOUTH DAILY, Disp: 90 tablet, Rfl: 1   omeprazole (PRILOSEC) 40 MG capsule, Take 1 capsule (40 mg total) by mouth daily., Disp: 30 capsule, Rfl: 3   Allergies  Allergen Reactions   Bee Venom      Social History   Tobacco Use   Smoking status: Never   Smokeless tobacco: Never  Vaping Use   Vaping Use: Never used  Substance Use Topics   Alcohol use: Yes    Alcohol/week: 0.0 standard drinks    Comment: occasionally on weekend   Drug use: No      Chart Review Today: I personally reviewed  active problem list, medication list, allergies, family history, social history, health maintenance, notes from last encounter, lab results, imaging with the patient/caregiver today.   Review of Systems  Constitutional: Negative.   HENT: Negative.    Eyes: Negative.   Respiratory: Negative.    Cardiovascular: Negative.   Gastrointestinal: Negative.   Endocrine: Negative.   Genitourinary: Negative.   Musculoskeletal: Negative.   Skin: Negative.   Allergic/Immunologic: Negative.   Neurological: Negative.   Hematological: Negative.   Psychiatric/Behavioral: Negative.    All other systems reviewed and are negative.     Objective:   Vitals:   10/01/21 1511  BP: (!) 142/96  Pulse: 94  Resp: 16  Temp: 98.3 F (36.8 C)  TempSrc: Oral  SpO2: 99%  Weight: 174 lb 14.4 oz (79.3 kg)  Height: 5\' 5"  (1.651 m)    Body mass index is 29.1 kg/m.  Physical Exam Vitals and nursing note reviewed.  Constitutional:      General: She is not in acute distress.    Appearance: Normal appearance. She is well-developed. She is not ill-appearing, toxic-appearing or diaphoretic.     Interventions: Face mask in place.  HENT:     Head: Normocephalic and atraumatic.     Right Ear: External ear normal.     Left Ear: External ear normal.  Eyes:     General: Lids are normal. No scleral icterus.       Right eye: No discharge.        Left eye: No discharge.     Conjunctiva/sclera: Conjunctivae normal.  Neck:     Trachea: Phonation normal. No tracheal deviation.  Cardiovascular:     Rate and Rhythm: Normal rate.     Pulses:          Radial pulses are 2+ on the right side and 2+ on the left side.       Posterior tibial pulses are 2+ on the right side and 2+ on the left side.  Pulmonary:     Effort: Pulmonary effort is normal.     Breath sounds: Normal breath sounds.  Abdominal:     General: Bowel sounds are normal. There is no distension.     Palpations: Abdomen is soft.  Musculoskeletal:      Right lower leg: No edema.     Left lower leg: No edema.  Skin:    General: Skin is warm and dry.     Coloration: Skin is not jaundiced or pale.     Findings: No rash.  Neurological:     Mental Status: She is alert.     Motor: No abnormal muscle tone.     Gait: Gait normal.  Psychiatric:  Mood and Affect: Mood is anxious. Mood is not depressed.        Speech: Speech normal.        Behavior: Behavior normal.        Thought Content: Thought content normal.          Assessment & Plan:     ICD-10-CM   1. Current moderate episode of major depressive disorder without prior episode (HCC)  F32.1    depressive sx a little better, no SI, anxiety/irritability sx a little worse, she wants to continue lexapro and buspar, refuses therapist    2. Encounter for completion of form with patient  Z02.89    FMLA forms - she talked to HR, requested they send forms back over, she will also send mychart msg to me once she gets more info    3. Essential hypertension  I10    BP elevated today, but pt was very upset, did not do recheck due to the situation - has previously been well controlled on chlorthalidone     [id number] - cell to f/up with pt   Keep upcoming appt for routine f/up - HTN, HLD, GERD (scheduled in Dec)    Danelle Berry, PA-C 10/01/21 3:37 PM

## 2021-10-29 ENCOUNTER — Ambulatory Visit: Payer: Commercial Managed Care - PPO | Admitting: Nurse Practitioner

## 2021-10-29 ENCOUNTER — Encounter: Payer: Self-pay | Admitting: Nurse Practitioner

## 2021-10-29 VITALS — BP 132/82 | HR 81 | Temp 97.5°F | Resp 16 | Ht 65.0 in | Wt 178.8 lb

## 2021-10-29 DIAGNOSIS — K219 Gastro-esophageal reflux disease without esophagitis: Secondary | ICD-10-CM

## 2021-10-29 DIAGNOSIS — E785 Hyperlipidemia, unspecified: Secondary | ICD-10-CM

## 2021-10-29 DIAGNOSIS — I10 Essential (primary) hypertension: Secondary | ICD-10-CM | POA: Diagnosis not present

## 2021-10-29 DIAGNOSIS — F321 Major depressive disorder, single episode, moderate: Secondary | ICD-10-CM

## 2021-10-29 DIAGNOSIS — J302 Other seasonal allergic rhinitis: Secondary | ICD-10-CM

## 2021-10-29 DIAGNOSIS — F419 Anxiety disorder, unspecified: Secondary | ICD-10-CM

## 2021-10-29 MED ORDER — ATORVASTATIN CALCIUM 40 MG PO TABS: 40.0000 mg | ORAL_TABLET | Freq: Every day | ORAL | 3 refills | Status: DC

## 2021-10-29 MED ORDER — BUSPIRONE HCL 7.5 MG PO TABS: ORAL_TABLET | ORAL | 2 refills | Status: DC

## 2021-10-29 MED ORDER — CHLORTHALIDONE 25 MG PO TABS: 12.5000 mg | ORAL_TABLET | Freq: Every day | ORAL | 3 refills | Status: DC

## 2021-10-29 MED ORDER — ESCITALOPRAM OXALATE 20 MG PO TABS: 20.0000 mg | ORAL_TABLET | Freq: Every day | ORAL | 2 refills | Status: DC

## 2021-10-29 MED ORDER — BUSPIRONE HCL 7.5 MG PO TABS: 7.5000 mg | ORAL_TABLET | Freq: Three times a day (TID) | ORAL | 2 refills | Status: DC

## 2021-10-29 MED ORDER — OMEPRAZOLE 40 MG PO CPDR: 40.0000 mg | DELAYED_RELEASE_CAPSULE | Freq: Every day | ORAL | 3 refills | Status: DC

## 2021-10-29 MED ORDER — AZELASTINE-FLUTICASONE 137-50 MCG/ACT NA SUSP: 2.0000 | Freq: Every day | NASAL | 5 refills | Status: DC

## 2021-10-29 MED ORDER — FEXOFENADINE HCL 180 MG PO TABS: ORAL_TABLET | ORAL | 1 refills | Status: DC

## 2021-10-29 NOTE — Progress Notes (Addendum)
BP 132/82   Pulse 81   Temp (!) 97.5 F (36.4 C) (Oral)   Resp 16   Ht 5\' 5"  (1.651 m)   Wt 178 lb 12.8 oz (81.1 kg)   SpO2 99%   BMI 29.75 kg/m    Subjective:    Patient ID: , female    DOB: 06-Nov-1966, 55 y.o.   MRN: [id number]  HPI: Cassidy Collins is a 55 y.o. female, here alone  Chief Complaint  Patient presents with   Follow-up   Hypertension   Hyperlipidemia   Gastroesophageal Reflux   Hypertension: Her blood pressure today in the office is 132/82.  She is taking chlorthalidone 12.5 mg daily.  She denies any dizziness, chest pain, shortness of breath, headache or blurred vision.  Will continue current treatment.  Hyperlipidemia: She is currently taking atorvastatin 40 mg daily.  She denies any myalgia.  Her last LDL was 97, on 04/29/21.  Will get labs today.   GERD: She is currently taking omeprazole 40 mg daily.  She says her acid reflux is well controlled with medication.  Will continue current treatment  Depression/Anxiety: She says she is doing better on the current treatment plan.  Her PHQ9 is currently 6, GAD is 6.  She is currently taking lexapro 20 mg and buspar TID PRN.   Depression screen [facility] 2/9 10/29/2021 10/01/2021 04/29/2021 11/11/2020 09/30/2020  Decreased Interest 1 3 2  0 1  Down, Depressed, Hopeless 2 3 2 1 1   PHQ - 2 Score 3 6 4 1 2   Altered sleeping 0 0 2 0 2  Tired, decreased energy 0 1 1 0 2  Change in appetite 0 0 0 0 2  Feeling bad or failure about yourself  2 1 1  0 2  Trouble concentrating 1 0 1 0 2  Moving slowly or fidgety/restless 0 0 2 0 1  Suicidal thoughts 0 0 0 0 0  PHQ-9 Score 6 8 11 1 13   Difficult doing work/chores Somewhat difficult Somewhat difficult Somewhat difficult Somewhat difficult Somewhat difficult  Some recent data might be hidden    GAD 7 : Generalized Anxiety Score 10/29/2021 10/01/2021 04/29/2021 09/30/2020  Nervous, Anxious, on Edge 1 1 1 2   Control/stop worrying 1 3 2 3   Worry too much -  different things 1 3 2 3   Trouble relaxing 1 1 2 2   Restless 0 0 2 1  Easily annoyed or irritable 1 3 1 2   Afraid - awful might happen 1 0 1 1  Total GAD 7 Score 6 11 11 14   Anxiety Difficulty Somewhat difficult Somewhat difficult Somewhat difficult Somewhat difficult   Allergies: She says she takes her allegra daily.  She says she is doing well with the current treatment.   Relevant past medical, surgical, family and social history reviewed and updated as indicated. Interim medical history since our last visit reviewed. Allergies and medications reviewed and updated.  Review of Systems  Constitutional: Negative for fever or weight change.  Respiratory: Negative for cough and shortness of breath.   Cardiovascular: Negative for chest pain or palpitations.  Gastrointestinal: Negative for abdominal pain, no bowel changes.  Musculoskeletal: Negative for gait problem or joint swelling.  Skin: Negative for rash.  Neurological: Negative for dizziness or headache.  No other specific complaints in a complete review of systems (except as listed in HPI above).      Objective:    BP 132/82   Pulse 81  Temp (!) 97.5 F (36.4 C) (Oral)   Resp 16   Ht 5\' 5"  (1.651 m)   Wt 178 lb 12.8 oz (81.1 kg)   SpO2 99%   BMI 29.75 kg/m   Wt Readings from Last 3 Encounters:  10/29/21 178 lb 12.8 oz (81.1 kg)  10/01/21 174 lb 14.4 oz (79.3 kg)  04/29/21 173 lb 11.2 oz (78.8 kg)    Physical Exam  Constitutional: Patient appears well-developed and well-nourished. No distress.  HEENT: head atraumatic, normocephalic, pupils equal and reactive to light,  neck supple Cardiovascular: Normal rate, regular rhythm and normal heart sounds.  No murmur heard. No BLE edema. Pulmonary/Chest: Effort normal and breath sounds normal. No respiratory distress. Abdominal: Soft.  There is no tenderness. Psychiatric: Patient has a normal mood and affect. behavior is normal. Judgment and thought content normal.    Results for orders placed or performed in visit on 04/29/21  Comprehensive metabolic panel  Result Value Ref Range   Glucose, Bld 88 65 - 99 mg/dL   BUN 17 7 - 25 mg/dL   Creat 06/29/21 4.43 - 1.54 mg/dL   BUN/Creatinine Ratio NOT APPLICABLE 6 - 22 (calc)   Sodium 140 135 - 146 mmol/L   Potassium 4.2 3.5 - 5.3 mmol/L   Chloride 101 98 - 110 mmol/L   CO2 30 20 - 32 mmol/L   Calcium 9.7 8.6 - 10.4 mg/dL   Total Protein 7.2 6.1 - 8.1 g/dL   Albumin 4.6 3.6 - 5.1 g/dL   Globulin 2.6 1.9 - 3.7 g/dL (calc)   AG Ratio 1.8 1.0 - 2.5 (calc)   Total Bilirubin 0.4 0.2 - 1.2 mg/dL   Alkaline phosphatase (APISO) 81 37 - 153 U/L   AST 18 10 - 35 U/L   ALT 20 6 - 29 U/L  Lipid panel  Result Value Ref Range   Cholesterol 189 <200 mg/dL   HDL 66 > OR = 50 mg/dL   Triglycerides 0.08 (H) <150 mg/dL   LDL Cholesterol (Calc) 97 mg/dL (calc)   Total CHOL/HDL Ratio 2.9 <5.0 (calc)   Non-HDL Cholesterol (Calc) 123 <130 mg/dL (calc)      Assessment & Plan:   1. Essential hypertension -continue current treatment - COMPLETE METABOLIC PANEL WITH GFR - chlorthalidone (HYGROTON) 25 MG tablet; Take 0.5 tablets (12.5 mg total) by mouth daily.  Dispense: 45 tablet; Refill: 3  2. Hyperlipidemia, unspecified hyperlipidemia type -continue current treatment - COMPLETE METABOLIC PANEL WITH GFR - Lipid panel - atorvastatin (LIPITOR) 40 MG tablet; Take 1 tablet (40 mg total) by mouth daily.  Dispense: 90 tablet; Refill: 3  3. Gastroesophageal reflux disease without esophagitis -continue current treatment plan  4. Current moderate episode of major depressive disorder without prior episode (HCC) -continue current treatment plan - escitalopram (LEXAPRO) 20 MG tablet; Take 1 tablet (20 mg total) by mouth daily.  Dispense: 90 tablet; Refill: 2 - busPIRone (BUSPAR) 7.5 MG tablet; Take 1 tablet (7.5 mg total) by mouth 3 (three) times daily.  Dispense: 90 tablet; Refill: 2  5. Anxiety -continue current treatment  plan - escitalopram (LEXAPRO) 20 MG tablet; Take 1 tablet (20 mg total) by mouth daily.  Dispense: 90 tablet; Refill: 2 - busPIRone (BUSPAR) 7.5 MG tablet; Take 1 tablet (7.5 mg total) by mouth 3 (three) times daily.  Dispense: 90 tablet; Refill: 2  6. Seasonal allergic rhinitis, unspecified trigger -continue current treatment plan - fexofenadine (ALLEGRA) 180 MG tablet; TAKE 1 TABLET(180 MG) BY MOUTH DAILY  Dispense: 90 tablet; Refill: 1 - Azelastine-Fluticasone 137-50 MCG/ACT SUSP; Place 2 sprays into the nose daily.  Dispense: 23 g; Refill: 5   Follow up plan: Return in about 6 months (around 04/29/2022) for follow up.

## 2021-10-30 LAB — COMPLETE METABOLIC PANEL WITH GFR
AG Ratio: 1.6 (calc) (ref 1.0–2.5)
ALT: 17 U/L (ref 6–29)
AST: 17 U/L (ref 10–35)
Albumin: 4 g/dL (ref 3.6–5.1)
Alkaline phosphatase (APISO): 79 U/L (ref 37–153)
BUN: 12 mg/dL (ref 7–25)
CO2: 28 mmol/L (ref 20–32)
Calcium: 9 mg/dL (ref 8.6–10.4)
Chloride: 102 mmol/L (ref 98–110)
Creat: 0.89 mg/dL (ref 0.50–1.03)
Globulin: 2.5 g/dL (calc) (ref 1.9–3.7)
Glucose, Bld: 85 mg/dL (ref 65–99)
Potassium: 4 mmol/L (ref 3.5–5.3)
Sodium: 139 mmol/L (ref 135–146)
Total Bilirubin: 0.4 mg/dL (ref 0.2–1.2)
Total Protein: 6.5 g/dL (ref 6.1–8.1)
eGFR: 77 mL/min/{1.73_m2} (ref >=60)

## 2021-10-30 LAB — LIPID PANEL
Cholesterol: 225 mg/dL — ABNORMAL HIGH (ref <200)
HDL: 72 mg/dL (ref >=50)
LDL Cholesterol (Calc): 128 mg/dL (calc) — ABNORMAL HIGH
Non-HDL Cholesterol (Calc): 153 mg/dL (calc) — ABNORMAL HIGH (ref <130)
Total CHOL/HDL Ratio: 3.1 (calc) (ref <5.0)
Triglycerides: 137 mg/dL (ref <150)

## 2021-11-19 ENCOUNTER — Other Ambulatory Visit: Payer: Self-pay | Admitting: Nurse Practitioner

## 2021-11-19 DIAGNOSIS — J302 Other seasonal allergic rhinitis: Secondary | ICD-10-CM

## 2021-11-19 MED ORDER — AZELASTINE HCL 0.1 % NA SOLN: 2.0000 | Freq: Two times a day (BID) | NASAL | 12 refills | Status: AC

## 2021-12-20 NOTE — Progress Notes (Signed)
PCP: Bo Merino, FNP   Chief Complaint  Patient presents with   Gynecologic Exam    No concerns    HPI:      [name] is a 56 y.o. G1P1001 who LMP was No LMP recorded. Patient is postmenopausal., presents today for her annual examination.  Her menses are absent due to premature menopause (age 78). She does not have PMB but had 1 episode brown spot with wiping 1 mo ago, no other bleeding. No urin or GI sx; wasn't recently sex active before. No sx since.   Sex activity: single partner, contraception - post menopausal status. She does not have vaginal dryness/pain/bleeding.  Last Pap: 10/16/18  Results were: no abnormalities /neg HPV DNA.  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  Last mammogram: 01/14/21 Results were: normal--routine follow-up in 12 months.  There is a FH of breast cancer in her mat aunt and ovarian cancer in her MGM. Pt's sister is BRCA neg. Pt has declined genetic testing several times in past. The patient does self-breast exams.  Colonoscopy: 2019 Repeat due after 5 years. Father with colon cancer.  DEXA: never  Tobacco use: The patient denies current or previous tobacco use. Alcohol use: social drinker  No drug use. Exercise: moderately active  She does get adequate calcium and Vitamin D in her diet.  Labs with PCP.   Past Medical History:  Diagnosis Date   Anxiety 2019   Family history of colon cancer    Family history of ovarian cancer    GERD (gastroesophageal reflux disease)    History of abnormal cervical Pap smear    positive HRHPV   History of mammogram 10/04/2016   neg   Hyperlipidemia     Past Surgical History:  Procedure Laterality Date   COLONOSCOPY  2010   Dr Adair Laundry   COLONOSCOPY W/ BIOPSIES  03/02/2018   hyperplastic polyp-repeat in 5 years/ Dr Alice Reichert   DILATION AND CURETTAGE OF UTERUS     LEEP  2005   for postcoital spotting/ path-B9   TUBAL LIGATION       Family History  Problem Relation Age of Onset   Colon cancer Father 50   Hypertension Father    Breast cancer Maternal Aunt 69       has contact   Ovarian cancer Maternal Grandmother 30   Hypertension Mother    Hypertension Sister     Social History   Socioeconomic History   Marital status: Married    Spouse name: Not on file   Number of children: 1   Years of education: Not on file   Highest education level: Not on file  Occupational History   Occupation: Chiropodist  Tobacco Use   Smoking status: Never   Smokeless tobacco: Never  Vaping Use   Vaping Use: Never used  Substance and Sexual Activity   Alcohol use: Yes    Alcohol/week: 0.0 standard drinks    Comment: occasionally on weekend   Drug use: No   Sexual activity: Yes    Partners: Male    Birth control/protection: Post-menopausal  Other Topics Concern   Not on file  Social History Narrative   Not on file   Social Determinants of Health   Financial Resource Strain: Not on file  Food Insecurity: Not on file  Transportation Needs: Not on file  Physical Activity: Not on file  Stress: Not on  file  Social Connections: Not on file  Intimate Partner Violence: Not on file    Outpatient Medications Prior to Visit  Medication Sig Dispense Refill   atorvastatin (LIPITOR) 40 MG tablet Take 1 tablet (40 mg total) by mouth daily. 90 tablet 3   azelastine (ASTELIN) 0.1 % nasal spray Place 2 sprays into both nostrils 2 (two) times daily. Use in each nostril as directed 30 mL 12   busPIRone (BUSPAR) 7.5 MG tablet Take 1 tablet (7.5 mg total) by mouth 3 (three) times daily. 90 tablet 2   chlorthalidone (HYGROTON) 25 MG tablet Take 0.5 tablets (12.5 mg total) by mouth daily. 45 tablet 3   escitalopram (LEXAPRO) 20 MG tablet Take 1 tablet (20 mg total) by mouth daily. 90 tablet 2   fexofenadine (ALLEGRA) 180 MG tablet TAKE 1 TABLET(180 MG) BY MOUTH DAILY 90 tablet 1   omeprazole (PRILOSEC) 40 MG capsule  Take 1 capsule (40 mg total) by mouth daily. 30 capsule 3   No facility-administered medications prior to visit.       ROS:  Review of Systems  Constitutional:  Negative for fatigue, fever and unexpected weight change.  Respiratory:  Negative for cough, shortness of breath and wheezing.   Cardiovascular:  Negative for chest pain, palpitations and leg swelling.  Gastrointestinal:  Negative for blood in stool, constipation, diarrhea, nausea and vomiting.  Endocrine: Negative for cold intolerance, heat intolerance and polyuria.  Genitourinary:  Negative for dyspareunia, dysuria, flank pain, frequency, genital sores, hematuria, menstrual problem, pelvic pain, urgency, vaginal bleeding, vaginal discharge and vaginal pain.  Musculoskeletal:  Negative for back pain, joint swelling and myalgias.  Skin:  Negative for rash.  Neurological:  Negative for dizziness, syncope, light-headedness, numbness and headaches.  Hematological:  Negative for adenopathy.  Psychiatric/Behavioral:  Negative for agitation, confusion, sleep disturbance and suicidal ideas. The patient is not nervous/anxious.  BREAST: No symptoms    Objective: BP 126/80    Ht 5' 5"  (1.651 m)    Wt 174 lb (78.9 kg)    BMI 28.96 kg/m    Physical Exam Constitutional:      Appearance: She is well-developed.  Genitourinary:     Vulva normal.     Right Labia: No rash, tenderness or lesions.    Left Labia: No tenderness, lesions or rash.    No vaginal discharge, erythema, tenderness or bleeding.      Right Adnexa: not tender and no mass present.    Left Adnexa: not tender and no mass present.    No cervical motion tenderness, friability or polyp.     Uterus is not enlarged or tender.  Breasts:    Right: No mass, nipple discharge, skin change or tenderness.     Left: No mass, nipple discharge, skin change or tenderness.  Neck:     Thyroid: No thyromegaly.  Cardiovascular:     Rate and Rhythm: Normal rate and regular rhythm.      Heart sounds: Normal heart sounds. No murmur heard. Pulmonary:     Effort: Pulmonary effort is normal.     Breath sounds: Normal breath sounds.  Abdominal:     Palpations: Abdomen is soft.     Tenderness: There is no abdominal tenderness. There is no guarding or rebound.  Musculoskeletal:        General: Normal range of motion.     Cervical back: Normal range of motion.  Lymphadenopathy:     Cervical: No cervical adenopathy.  Neurological:  General: No focal deficit present.     Mental Status: She is alert and oriented to person, place, and time.     Cranial Nerves: No cranial nerve deficit.  Skin:    General: Skin is warm and dry.  Psychiatric:        Mood and Affect: Mood normal.        Behavior: Behavior normal.        Thought Content: Thought content normal.        Judgment: Judgment normal.  Vitals reviewed.    Assessment/Plan: Encounter for annual routine gynecological examination  Cervical cancer screening - Plan: Cytology - PAP  Screening for HPV (human papillomavirus) - Plan: Cytology - PAP  Encounter for screening mammogram for malignant neoplasm of breast - Plan: MM 3D SCREEN BREAST BILATERAL; pt to sheds mammo  Early menopause - Plan: DG Bone Density; pt to sheds DEXA with mammo; cont ca/Vit D/exercise  Family history of ovarian cancer--MyRisk testing discussed. Pt to consider and f/u prn.   Screening for osteoporosis - Plan: DG Bone Density  PMB--1 episode brown with wiping, but nothing else. Unsure if PMB vs other. Pt to f/u if sx recur for GYN u/s.         GYN counsel breast self exam, mammography screening, menopause, adequate intake of calcium and vitamin D, diet and exercise    F/U  Return in about 1 year (around 12/21/2022).  Harmonie Verrastro B. Maleeka Sabatino, PA-C 12/21/2021 11:14 AM

## 2021-12-21 ENCOUNTER — Other Ambulatory Visit (HOSPITAL_COMMUNITY)
Admission: RE | Admit: 2021-12-21 | Discharge: 2021-12-21 | Disposition: A | Discharge status: Home / Self Care | Payer: Commercial Managed Care - PPO | Source: Ambulatory Visit | Attending: Obstetrics and Gynecology | Admitting: Obstetrics and Gynecology

## 2021-12-21 ENCOUNTER — Other Ambulatory Visit: Payer: Self-pay

## 2021-12-21 ENCOUNTER — Ambulatory Visit (INDEPENDENT_AMBULATORY_CARE_PROVIDER_SITE_OTHER): Payer: Commercial Managed Care - PPO | Admitting: Obstetrics and Gynecology

## 2021-12-21 ENCOUNTER — Encounter: Payer: Self-pay | Admitting: Obstetrics and Gynecology

## 2021-12-21 VITALS — BP 126/80 | Ht 65.0 in | Wt 174.0 lb

## 2021-12-21 DIAGNOSIS — Z1231 Encounter for screening mammogram for malignant neoplasm of breast: Secondary | ICD-10-CM

## 2021-12-21 DIAGNOSIS — Z01419 Encounter for gynecological examination (general) (routine) without abnormal findings: Secondary | ICD-10-CM

## 2021-12-21 DIAGNOSIS — Z1151 Encounter for screening for human papillomavirus (HPV): Secondary | ICD-10-CM

## 2021-12-21 DIAGNOSIS — Z124 Encounter for screening for malignant neoplasm of cervix: Secondary | ICD-10-CM

## 2021-12-21 DIAGNOSIS — Z8041 Family history of malignant neoplasm of ovary: Secondary | ICD-10-CM

## 2021-12-21 DIAGNOSIS — Z13820 Encounter for screening for osteoporosis: Secondary | ICD-10-CM

## 2021-12-21 DIAGNOSIS — E28319 Asymptomatic premature menopause: Secondary | ICD-10-CM

## 2021-12-21 NOTE — Patient Instructions (Addendum)
I value your feedback and you entrusting us with your care. If you get a [facility] patient survey, I would appreciate you taking the time to let us know about your experience today. Thank you!  [facility]: [phone]      

## 2021-12-23 LAB — CYTOLOGY - PAP
Comment: NEGATIVE
Diagnosis: NEGATIVE
High risk HPV: NEGATIVE

## 2021-12-27 ENCOUNTER — Encounter: Payer: Self-pay | Admitting: Nurse Practitioner

## 2021-12-29 ENCOUNTER — Encounter: Payer: Self-pay | Admitting: Nurse Practitioner

## 2021-12-29 ENCOUNTER — Telehealth (INDEPENDENT_AMBULATORY_CARE_PROVIDER_SITE_OTHER): Payer: Commercial Managed Care - PPO | Admitting: Nurse Practitioner

## 2021-12-29 ENCOUNTER — Other Ambulatory Visit: Payer: Self-pay

## 2021-12-29 DIAGNOSIS — J0140 Acute pansinusitis, unspecified: Secondary | ICD-10-CM

## 2021-12-29 DIAGNOSIS — R051 Acute cough: Secondary | ICD-10-CM

## 2021-12-29 MED ORDER — AMOXICILLIN-POT CLAVULANATE 875-125 MG PO TABS: 1.0000 | ORAL_TABLET | Freq: Two times a day (BID) | ORAL | 0 refills | Status: DC

## 2021-12-29 MED ORDER — BENZONATATE 100 MG PO CAPS: 200.0000 mg | ORAL_CAPSULE | Freq: Two times a day (BID) | ORAL | 0 refills | Status: DC | PRN

## 2021-12-29 NOTE — Progress Notes (Signed)
Name: Cassidy Collins   MRN: [id number]    DOB: 08/28/1966   Date:12/29/2021       Progress Note  Subjective  Chief Complaint  Chief Complaint  Patient presents with   Sinusitis    Cough, runny nose, ear pain for 2 weeks    I connected with  Paulita Cradle  on 12/29/21 at  1:00 PM EST by a video enabled telemedicine application and verified that I am speaking with the correct person using two identifiers.  I discussed the limitations of evaluation and management by telemedicine and the availability of in person appointments. The patient expressed understanding and agreed to proceed with a virtual visit  Staff also discussed with the patient that there may be a patient responsible charge related to this service. Patient Location: home Provider Location: [facility] Additional Individuals present: alone  HPI  Sinusitis/cough:  She says she has had nasal congestion, ear pain and cough for two weeks. She says initially she felt like she was improving but now it has gotten worse.  She says she has tried OTC treatments with no relief.  She says that she has had facial tenderness and swelling.  She denies any fever or shortness of breath.  She says she has done covid tests and they were negative.  Due to being outside of the window for antiviral treatment declined testing. Discussed treatment with antibiotics and side effects.  Patient is agreeable to plan.  Also discussed continuing OTC treatments for symptoms and will send in prescription for tessalon perls.   Patient Active Problem List   Diagnosis Date Noted   COVID 11/11/2020   Early menopause 10/27/2019   Current moderate episode of major depressive disorder without prior episode ([place]) 01/17/2019   Anxiety 01/17/2019   Essential hypertension 01/17/2019   Family history of colon cancer    Family history of ovarian cancer    Hyperlipidemia 09/01/2017   Overweight (BMI 25.0-29.9) 09/01/2017   Other allergic rhinitis 09/01/2017    Gastroesophageal reflux disease without esophagitis 09/01/2017    Social History   Tobacco Use   Smoking status: Never   Smokeless tobacco: Never  Substance Use Topics   Alcohol use: Yes    Alcohol/week: 0.0 standard drinks    Comment: occasionally on weekend     Current Outpatient Medications:    atorvastatin (LIPITOR) 40 MG tablet, Take 1 tablet (40 mg total) by mouth daily., Disp: 90 tablet, Rfl: 3   azelastine (ASTELIN) 0.1 % nasal spray, Place 2 sprays into both nostrils 2 (two) times daily. Use in each nostril as directed, Disp: 30 mL, Rfl: 12   busPIRone (BUSPAR) 7.5 MG tablet, Take 1 tablet (7.5 mg total) by mouth 3 (three) times daily., Disp: 90 tablet, Rfl: 2   chlorthalidone (HYGROTON) 25 MG tablet, Take 0.5 tablets (12.5 mg total) by mouth daily., Disp: 45 tablet, Rfl: 3   escitalopram (LEXAPRO) 20 MG tablet, Take 1 tablet (20 mg total) by mouth daily., Disp: 90 tablet, Rfl: 2   fexofenadine (ALLEGRA) 180 MG tablet, TAKE 1 TABLET(180 MG) BY MOUTH DAILY, Disp: 90 tablet, Rfl: 1   omeprazole (PRILOSEC) 40 MG capsule, Take 1 capsule (40 mg total) by mouth daily., Disp: 30 capsule, Rfl: 3  Allergies  Allergen Reactions   Bee Venom     I personally reviewed active problem list, medication list, allergies, notes from last encounter with the patient/caregiver today.  ROS  Constitutional: Negative for fever or weight change.  HEENT:  positive for nasal congestion, sinus pressure and facial tenderness Respiratory: positive for cough and negative for shortness of breath.   Cardiovascular: Negative for chest pain or palpitations.  Gastrointestinal: Negative for abdominal pain, no bowel changes.  Musculoskeletal: Negative for gait problem or joint swelling.  Skin: Negative for rash.  Neurological: Negative for dizziness or headache.  No other specific complaints in a complete review of systems (except as listed in HPI above).   Objective  Virtual encounter, vitals not  obtained.  There is no height or weight on file to calculate BMI.  Nursing Note and Vital Signs reviewed.  Physical Exam  Awake, alert and oriented, speaking in complete sentences  No results found for this or any previous visit (from the past 72 hour(s)).  Assessment & Plan  1. Acute non-recurrent pansinusitis -keep taking OTC treatments for symptoms -push fluids - amoxicillin-clavulanate (AUGMENTIN) 875-125 MG tablet; Take 1 tablet by mouth 2 (two) times daily.  Dispense: 20 tablet; Refill: 0  2. Acute cough -keep taking OTC treatments for symptoms -push fluids - benzonatate (TESSALON) 100 MG capsule; Take 2 capsules (200 mg total) by mouth 2 (two) times daily as needed for cough.  Dispense: 20 capsule; Refill: 0   -Red flags and when to present for emergency care or RTC including fever >101.33F, chest pain, shortness of breath, new/worsening/un-resolving symptoms,  reviewed with patient at time of visit. Follow up and care instructions discussed and provided in AVS. - I discussed the assessment and treatment plan with the patient. The patient was provided an opportunity to ask questions and all were answered. The patient agreed with the plan and demonstrated an understanding of the instructions.  I provided 15 minutes of non-face-to-face time during this encounter.  Bo Merino, FNP

## 2022-02-01 ENCOUNTER — Ambulatory Visit
Admission: RE | Admit: 2022-02-01 | Discharge: 2022-02-01 | Disposition: A | Discharge status: Home / Self Care | Payer: Commercial Managed Care - PPO | Source: Ambulatory Visit | Attending: Obstetrics and Gynecology | Admitting: Obstetrics and Gynecology

## 2022-02-01 ENCOUNTER — Other Ambulatory Visit: Payer: Self-pay

## 2022-02-01 DIAGNOSIS — Z1231 Encounter for screening mammogram for malignant neoplasm of breast: Secondary | ICD-10-CM

## 2022-02-01 DIAGNOSIS — E28319 Asymptomatic premature menopause: Secondary | ICD-10-CM | POA: Diagnosis present

## 2022-02-01 DIAGNOSIS — Z13820 Encounter for screening for osteoporosis: Secondary | ICD-10-CM | POA: Insufficient documentation

## 2022-04-25 NOTE — Progress Notes (Unsigned)
[name], FNP   No chief complaint on file.   HPI:      [name] is a 56 y.o. G1P1001 whose LMP was No LMP recorded. Patient is postmenopausal., presents today for PMB. No menses since age 30 with premature menopause.     Patient Active Problem List   Diagnosis Date Noted   COVID 11/11/2020   Early menopause 10/27/2019   Current moderate episode of major depressive disorder without prior episode ([facility]) 01/17/2019   Anxiety 01/17/2019   Essential hypertension 01/17/2019   Family history of colon cancer    Family history of ovarian cancer    Hyperlipidemia 09/01/2017   Overweight (BMI 25.0-29.9) 09/01/2017   Other allergic rhinitis 09/01/2017   Gastroesophageal reflux disease without esophagitis 09/01/2017    Past Surgical History:  Procedure Laterality Date   COLONOSCOPY  2010   Dr Vanetta Shawl   COLONOSCOPY W/ BIOPSIES  03/02/2018   hyperplastic polyp-repeat in 5 years/ Dr Norma Fredrickson   DILATION AND CURETTAGE OF UTERUS     LEEP  2005   for postcoital spotting/ path-B9   TUBAL LIGATION      Family History  Problem Relation Age of Onset   Colon cancer Father 89   Hypertension Father    Breast cancer Maternal Aunt 70       has contact   Ovarian cancer Maternal Grandmother 21   Hypertension Mother    Hypertension Sister     Social History   Socioeconomic History   Marital status: Married    Spouse name: Not on file   Number of children: 1   Years of education: Not on file   Highest education level: Not on file  Occupational History   Occupation: Neurosurgeon  Tobacco Use   Smoking status: Never   Smokeless tobacco: Never  Vaping Use   Vaping Use: Never used  Substance and Sexual Activity   Alcohol use: Yes    Alcohol/week: 0.0 standard drinks    Comment: occasionally on weekend   Drug use: No   Sexual activity: Yes    Partners: Male    Birth control/protection: Post-menopausal  Other Topics Concern   Not on file   Social History Narrative   Not on file   Social Determinants of Health   Financial Resource Strain: Not on file  Food Insecurity: Not on file  Transportation Needs: Not on file  Physical Activity: Not on file  Stress: Not on file  Social Connections: Not on file  Intimate Partner Violence: Not on file    Outpatient Medications Prior to Visit  Medication Sig Dispense Refill   amoxicillin-clavulanate (AUGMENTIN) 875-125 MG tablet Take 1 tablet by mouth 2 (two) times daily. 20 tablet 0   atorvastatin (LIPITOR) 40 MG tablet Take 1 tablet (40 mg total) by mouth daily. 90 tablet 3   azelastine (ASTELIN) 0.1 % nasal spray Place 2 sprays into both nostrils 2 (two) times daily. Use in each nostril as directed 30 mL 12   benzonatate (TESSALON) 100 MG capsule Take 2 capsules (200 mg total) by mouth 2 (two) times daily as needed for cough. 20 capsule 0   busPIRone (BUSPAR) 7.5 MG tablet Take 1 tablet (7.5 mg total) by mouth 3 (three) times daily. 90 tablet 2   chlorthalidone (HYGROTON) 25 MG tablet Take 0.5 tablets (12.5 mg total) by mouth daily. 45 tablet 3   escitalopram (LEXAPRO) 20 MG tablet Take 1 tablet (20 mg total) by  mouth daily. 90 tablet 2   fexofenadine (ALLEGRA) 180 MG tablet TAKE 1 TABLET(180 MG) BY MOUTH DAILY 90 tablet 1   omeprazole (PRILOSEC) 40 MG capsule Take 1 capsule (40 mg total) by mouth daily. 30 capsule 3   No facility-administered medications prior to visit.      ROS:  Review of Systems BREAST: No symptoms   OBJECTIVE:   Vitals:  There were no vitals taken for this visit.  Physical Exam  Results: No results found for this or any previous visit (from the past 24 hour(s)).   Assessment/Plan: No diagnosis found.    No orders of the defined types were placed in this encounter.     No follow-ups on file.  Syon Tews B. Ziair Penson, PA-C 04/25/2022 7:28 PM

## 2022-04-26 ENCOUNTER — Ambulatory Visit: Payer: Commercial Managed Care - PPO | Admitting: Obstetrics and Gynecology

## 2022-04-26 ENCOUNTER — Encounter: Payer: Self-pay | Admitting: Obstetrics and Gynecology

## 2022-04-26 VITALS — BP 100/60 | Ht 65.0 in | Wt 173.0 lb

## 2022-04-26 DIAGNOSIS — N950 Postmenopausal bleeding: Secondary | ICD-10-CM | POA: Diagnosis not present

## 2022-04-29 ENCOUNTER — Ambulatory Visit: Payer: Commercial Managed Care - PPO | Admitting: Nurse Practitioner

## 2022-05-06 ENCOUNTER — Ambulatory Visit: Payer: Commercial Managed Care - PPO | Admitting: Nurse Practitioner

## 2022-05-12 ENCOUNTER — Telehealth: Payer: Self-pay | Admitting: Obstetrics and Gynecology

## 2022-05-12 ENCOUNTER — Ambulatory Visit: Payer: Commercial Managed Care - PPO | Admitting: Obstetrics and Gynecology

## 2022-05-12 ENCOUNTER — Ambulatory Visit (INDEPENDENT_AMBULATORY_CARE_PROVIDER_SITE_OTHER): Payer: Commercial Managed Care - PPO

## 2022-05-12 DIAGNOSIS — N939 Abnormal uterine and vaginal bleeding, unspecified: Secondary | ICD-10-CM

## 2022-05-12 DIAGNOSIS — N950 Postmenopausal bleeding: Secondary | ICD-10-CM | POA: Diagnosis not present

## 2022-05-12 DIAGNOSIS — Z780 Asymptomatic menopausal state: Secondary | ICD-10-CM

## 2022-05-12 NOTE — Telephone Encounter (Signed)
Pt aware of GYN u/s results for PMB. EM=18 mm, will RTO for EMB. Pt also with small complex cyst RTO but pt should be postmenopausal per her hx/sx for 13 yrs (pt age 56 now). Will check FSH/estradiol at EMB appt to confirm menopause.

## 2022-05-17 ENCOUNTER — Encounter: Payer: Self-pay | Admitting: Obstetrics and Gynecology

## 2022-05-17 ENCOUNTER — Other Ambulatory Visit: Payer: Self-pay

## 2022-05-17 ENCOUNTER — Other Ambulatory Visit (HOSPITAL_COMMUNITY)
Admission: RE | Admit: 2022-05-17 | Discharge: 2022-05-17 | Disposition: A | Discharge status: Home / Self Care | Payer: Commercial Managed Care - PPO | Source: Ambulatory Visit | Attending: Obstetrics and Gynecology | Admitting: Obstetrics and Gynecology

## 2022-05-17 ENCOUNTER — Ambulatory Visit: Payer: Commercial Managed Care - PPO | Admitting: Obstetrics and Gynecology

## 2022-05-17 VITALS — BP 100/60 | Ht 65.0 in | Wt 172.0 lb

## 2022-05-17 DIAGNOSIS — N912 Amenorrhea, unspecified: Secondary | ICD-10-CM | POA: Diagnosis not present

## 2022-05-17 DIAGNOSIS — N950 Postmenopausal bleeding: Secondary | ICD-10-CM | POA: Insufficient documentation

## 2022-05-17 DIAGNOSIS — Z780 Asymptomatic menopausal state: Secondary | ICD-10-CM

## 2022-05-17 DIAGNOSIS — N83201 Unspecified ovarian cyst, right side: Secondary | ICD-10-CM | POA: Diagnosis not present

## 2022-05-17 NOTE — Progress Notes (Signed)
[name], FNP   Chief Complaint  Patient presents with   Endometrial Biopsy    HPI:      [name] is a 56 y.o. G1P1001 whose LMP was No LMP recorded. Patient is postmenopausal., presents today for EMB for suspected PMB. Still having occas pink/red bleeding, triggered by sex and exercise. Pt hasn't had LMP since 2/09 but hx of irregular menses, sometimes only twice yrly, since adolescence. Had monthly periods with OCPs years ago, but infrequent off pills. Had a few months here and there with monthly menses off OCPs. Didn't have FSH that confirmed menopause 2009 and was having withdrawal bleed after provera tx at that time. Pt didn't want to continue provera if didn't need it, so just never took it again when told that was ok. Didn't have bleeding after that until now. Also with 1.2 cm complex cyst on RTO.  The Endometrium measures 18 mm; echogenic with cystic changes.  Menstrual hx per pt: Menses twice yrly from menarche to adolescence Started OCPs 10th grade with monthly menses 1998 pregnancy, restarted OCPs PP with monthly menses 2/99 with BTL so stopped OCPs, no menses 2004 with AUB, s/p D&C; menses monthly for 5 yrs then  LMP 2/09 2/10 pink d/c; given provera with withdrawal bleeds  Patient Active Problem List   Diagnosis Date Noted   COVID 11/11/2020   Early menopause 10/27/2019   Current moderate episode of major depressive disorder without prior episode ([facility]) 01/17/2019   Anxiety 01/17/2019   Essential hypertension 01/17/2019   Family history of colon cancer    Family history of ovarian cancer    Hyperlipidemia 09/01/2017   Overweight (BMI 25.0-29.9) 09/01/2017   Other allergic rhinitis 09/01/2017   Gastroesophageal reflux disease without esophagitis 09/01/2017    Past Surgical History:  Procedure Laterality Date   COLONOSCOPY  2010   Dr Vanetta Shawl   COLONOSCOPY W/ BIOPSIES  03/02/2018   hyperplastic polyp-repeat in 5 years/ Dr Norma Fredrickson    DILATION AND CURETTAGE OF UTERUS     LEEP  2005   for postcoital spotting/ path-B9   TUBAL LIGATION      Family History  Problem Relation Age of Onset   Colon cancer Father 34   Hypertension Father    Breast cancer Maternal Aunt 23       has contact   Ovarian cancer Maternal Grandmother 49   Hypertension Mother    Hypertension Sister     Social History   Socioeconomic History   Marital status: Married    Spouse name: Not on file   Number of children: 1   Years of education: Not on file   Highest education level: Not on file  Occupational History   Occupation: Neurosurgeon  Tobacco Use   Smoking status: Never   Smokeless tobacco: Never  Vaping Use   Vaping Use: Never used  Substance and Sexual Activity   Alcohol use: Yes    Alcohol/week: 0.0 standard drinks of alcohol    Comment: occasionally on weekend   Drug use: No   Sexual activity: Yes    Partners: Male    Birth control/protection: Post-menopausal  Other Topics Concern   Not on file  Social History Narrative   Not on file   Social Determinants of Health   Financial Resource Strain: Not on file  Food Insecurity: Not on file  Transportation Needs: Not on file  Physical Activity: Not on file  Stress: Not on file  Social Connections: Not on file  Intimate Partner Violence: Not on file    Outpatient Medications Prior to Visit  Medication Sig Dispense Refill   atorvastatin (LIPITOR) 40 MG tablet Take 1 tablet (40 mg total) by mouth daily. 90 tablet 3   azelastine (ASTELIN) 0.1 % nasal spray Place 2 sprays into both nostrils 2 (two) times daily. Use in each nostril as directed 30 mL 12   busPIRone (BUSPAR) 7.5 MG tablet Take 1 tablet (7.5 mg total) by mouth 3 (three) times daily. 90 tablet 2   chlorthalidone (HYGROTON) 25 MG tablet Take 0.5 tablets (12.5 mg total) by mouth daily. 45 tablet 3   escitalopram (LEXAPRO) 20 MG tablet Take 1 tablet (20 mg total) by mouth daily. 90 tablet 2    fexofenadine (ALLEGRA) 180 MG tablet TAKE 1 TABLET(180 MG) BY MOUTH DAILY 90 tablet 1   omeprazole (PRILOSEC) 40 MG capsule Take 1 capsule (40 mg total) by mouth daily. 30 capsule 3   No facility-administered medications prior to visit.      ROS:  Review of Systems  Constitutional:  Negative for fever.  Gastrointestinal:  Negative for blood in stool, constipation, diarrhea, nausea and vomiting.  Genitourinary:  Positive for vaginal bleeding. Negative for dyspareunia, dysuria, flank pain, frequency, hematuria, urgency, vaginal discharge and vaginal pain.  Musculoskeletal:  Negative for back pain.  Skin:  Negative for rash.   BREAST: No symptoms   OBJECTIVE:   Vitals:  BP 100/60   Ht 5\' 5"  (1.651 m)   Wt 172 lb (78 kg)   BMI 28.62 kg/m   Physical Exam Vitals reviewed.  Constitutional:      Appearance: She is well-developed.  Pulmonary:     Effort: Pulmonary effort is normal.  Genitourinary:    General: Normal vulva.     Pubic Area: No rash.      Labia:        Right: No rash, tenderness or lesion.        Left: No rash, tenderness or lesion.      Vagina: Normal. No vaginal discharge, erythema or tenderness.     Cervix: Normal.  Musculoskeletal:        General: Normal range of motion.     Cervical back: Normal range of motion.  Skin:    General: Skin is warm and dry.  Neurological:     General: No focal deficit present.     Mental Status: She is alert and oriented to person, place, and time.  Psychiatric:        Mood and Affect: Mood normal.        Behavior: Behavior normal.        Thought Content: Thought content normal.        Judgment: Judgment normal.   Endometrial Biopsy After discussion with the patient regarding her abnormal uterine bleeding I recommended that she proceed with an endometrial biopsy for further diagnosis. The risks, benefits, alternatives, and indications for an endometrial biopsy were discussed with the patient in detail. She understood the  risks including infection, bleeding.  Verbal consent was obtained.   PROCEDURE NOTE:  P[place] endometrial biopsy was performed using aseptic technique with iodine preparation.  The uterus was sounded to a length of 11.0 cm.  Adequate sampling was obtained with minimal blood loss.  The patient tolerated the procedure well.  Disposition will be pending pathology.  Assessment/Plan: PMB (postmenopausal bleeding) - Plan: Surgical pathology; EMB today. Check labs for menopause. Will f/u with results and  dispo.   Right ovarian cyst--check labs for menopause. If postmenopausal, will recheck u/s in 12 wks for resolution.     Return if symptoms worsen or fail to improve.  Chastelyn Athens B. Yuritzy Zehring, PA-C 05/17/2022 12:13 PM

## 2022-05-18 LAB — ESTRADIOL: Estradiol: 33.9 pg/mL

## 2022-05-18 LAB — FOLLICLE STIMULATING HORMONE: FSH: 17.6 m[IU]/mL

## 2022-05-19 ENCOUNTER — Other Ambulatory Visit: Payer: Self-pay

## 2022-05-19 ENCOUNTER — Telehealth: Payer: Self-pay | Admitting: Obstetrics and Gynecology

## 2022-05-19 ENCOUNTER — Telehealth (INDEPENDENT_AMBULATORY_CARE_PROVIDER_SITE_OTHER): Payer: Commercial Managed Care - PPO | Admitting: Nurse Practitioner

## 2022-05-19 ENCOUNTER — Encounter: Payer: Self-pay | Admitting: Nurse Practitioner

## 2022-05-19 DIAGNOSIS — K219 Gastro-esophageal reflux disease without esophagitis: Secondary | ICD-10-CM | POA: Diagnosis not present

## 2022-05-19 DIAGNOSIS — E785 Hyperlipidemia, unspecified: Secondary | ICD-10-CM

## 2022-05-19 DIAGNOSIS — F321 Major depressive disorder, single episode, moderate: Secondary | ICD-10-CM

## 2022-05-19 DIAGNOSIS — F419 Anxiety disorder, unspecified: Secondary | ICD-10-CM

## 2022-05-19 DIAGNOSIS — I10 Essential (primary) hypertension: Secondary | ICD-10-CM

## 2022-05-19 LAB — SURGICAL PATHOLOGY

## 2022-05-19 MED ORDER — ATORVASTATIN CALCIUM 40 MG PO TABS: 40.0000 mg | ORAL_TABLET | Freq: Every day | ORAL | 3 refills | Status: DC

## 2022-05-19 MED ORDER — CHLORTHALIDONE 25 MG PO TABS: 12.5000 mg | ORAL_TABLET | Freq: Every day | ORAL | 3 refills | Status: DC

## 2022-05-19 MED ORDER — BUSPIRONE HCL 7.5 MG PO TABS: 7.5000 mg | ORAL_TABLET | Freq: Three times a day (TID) | ORAL | 2 refills | Status: DC

## 2022-05-19 MED ORDER — OMEPRAZOLE 40 MG PO CPDR: 40.0000 mg | DELAYED_RELEASE_CAPSULE | Freq: Every day | ORAL | 3 refills | Status: DC

## 2022-05-19 MED ORDER — MEDROXYPROGESTERONE ACETATE 10 MG PO TABS: 10.0000 mg | ORAL_TABLET | Freq: Every day | ORAL | 0 refills | Status: DC

## 2022-05-19 MED ORDER — ESCITALOPRAM OXALATE 20 MG PO TABS: 20.0000 mg | ORAL_TABLET | Freq: Every day | ORAL | 2 refills | Status: DC

## 2022-05-19 NOTE — Progress Notes (Signed)
Name: Cassidy Collins   MRN: [id number]    DOB: 1966/01/23   Date:05/19/2022       Progress Note  Subjective  Chief Complaint  Chief Complaint  Patient presents with   Depression    Follow up   Hypertension   Hyperlipidemia    I connected with  [name]  on 05/19/22 at  8:40 AM EDT by a telephone enabled telemedicine application and verified that I am speaking with the correct person using two identifiers.  I discussed the limitations of evaluation and management by telemedicine and the availability of in person appointments. The patient expressed understanding and agreed to proceed with the virtual visit  Staff also discussed with the patient that there may be a patient responsible charge related to this service. Patient Location: home Provider Location: home Additional Individuals present: alone  HPI  HTN: She is currently taking chlorthalidone 12.5 mg daily. She denies any headaches, blurred vision, chest pain or shortness of breath.  She says she was recently seen in the GYN office and her blood pressure was 100/60. She says she has lost some weight and has been doing a lot of walking. She denies any dizziness. If blood pressure continues to be on the lower side may consider discontinuing blood pressure medication.    Hyperlipidemia: Her last LDL was 128 on 10/29/2021. She is currently on atorvastatin 40 mg daily.  She denies any myalgia.  The 10-year ASCVD risk score (Arnett DK, et al., 2019) is: 1.6%   Values used to calculate the score:     Age: 56 years     Sex: Female     Is Non-Hispanic African American: Yes     Diabetic: No     Tobacco smoker: No     Systolic Blood Pressure: 100 mmHg     Is BP treated: Yes     HDL Cholesterol: 72 mg/dL     Total Cholesterol: 225 mg/dL   GERD: Patient is currently taking omeprazole 40 mg daily. Patient reports that her acid reflux has improved since losing weight.   Depression/anxiety: PHQ9 and GAD scores positive.   Patient is currently taking lexapro 20 mg daily and buspar 7.5 mg three times a day. Patient reports that her stress has been increased lately due to health concerns, work and issues with her flooring.  She recently had to have a biopsy of her cervix done due to vaginal bleeding. She is seeing Dr. Patsy Lager GYN. She is awaiting those results. She is taking her medication as prescribed and will continue with current treatment plan.      05/19/2022    8:03 AM 12/29/2021   11:15 AM 10/29/2021    9:50 AM 10/01/2021    3:09 PM 04/29/2021    9:45 AM  Depression screen PHQ 2/9  Decreased Interest 1 0 1 3 2   Down, Depressed, Hopeless 1 0 2 3 2   PHQ - 2 Score 2 0 3 6 4   Altered sleeping 1 0 0 0 2  Tired, decreased energy 1 0 0 1 1  Change in appetite 1 0 0 0 0  Feeling bad or failure about yourself  1 0 2 1 1   Trouble concentrating 1 0 1 0 1  Moving slowly or fidgety/restless 0 0 0 0 2  Suicidal thoughts 0 0 0 0 0  PHQ-9 Score 7 0 6 8 11   Difficult doing work/chores Somewhat difficult Not difficult at all Somewhat difficult Somewhat difficult Somewhat  difficult       05/19/2022    8:05 AM 12/29/2021   11:16 AM 10/29/2021    9:57 AM 10/01/2021    3:10 PM  GAD 7 : Generalized Anxiety Score  Nervous, Anxious, on Edge 1 0 1 1  Control/stop worrying 1 0 1 3  Worry too much - different things 1 0 1 3  Trouble relaxing 1 0 1 1  Restless 0 0 0 0  Easily annoyed or irritable 1 0 1 3  Afraid - awful might happen 1 0 1 0  Total GAD 7 Score 6 0 6 11  Anxiety Difficulty Not difficult at all Not difficult at all Somewhat difficult Somewhat difficult     Patient Active Problem List   Diagnosis Date Noted   COVID 11/11/2020   Early menopause 10/27/2019   Current moderate episode of major depressive disorder without prior episode (HCC) 01/17/2019   Anxiety 01/17/2019   Essential hypertension 01/17/2019   Family history of colon cancer    Family history of ovarian cancer    Hyperlipidemia 09/01/2017    Overweight (BMI 25.0-29.9) 09/01/2017   Other allergic rhinitis 09/01/2017   Gastroesophageal reflux disease without esophagitis 09/01/2017    Past Surgical History:  Procedure Laterality Date   COLONOSCOPY  2010   Dr Vanetta Shawl   COLONOSCOPY W/ BIOPSIES  03/02/2018   hyperplastic polyp-repeat in 5 years/ Dr Norma Fredrickson   DILATION AND CURETTAGE OF UTERUS     LEEP  2005   for postcoital spotting/ path-B9   TUBAL LIGATION      Family History  Problem Relation Age of Onset   Colon cancer Father 65   Hypertension Father    Breast cancer Maternal Aunt 59       has contact   Ovarian cancer Maternal Grandmother 56   Hypertension Mother    Hypertension Sister     Social History   Socioeconomic History   Marital status: Married    Spouse name: Not on file   Number of children: 1   Years of education: Not on file   Highest education level: Not on file  Occupational History   Occupation: Neurosurgeon  Tobacco Use   Smoking status: Never   Smokeless tobacco: Never  Vaping Use   Vaping Use: Never used  Substance and Sexual Activity   Alcohol use: Yes    Alcohol/week: 0.0 standard drinks of alcohol    Comment: occasionally on weekend   Drug use: No   Sexual activity: Yes    Partners: Male    Birth control/protection: Post-menopausal  Other Topics Concern   Not on file  Social History Narrative   Not on file   Social Determinants of Health   Financial Resource Strain: Not on file  Food Insecurity: Not on file  Transportation Needs: Not on file  Physical Activity: Not on file  Stress: Not on file  Social Connections: Not on file  Intimate Partner Violence: Not on file     Current Outpatient Medications:    atorvastatin (LIPITOR) 40 MG tablet, Take 1 tablet (40 mg total) by mouth daily., Disp: 90 tablet, Rfl: 3   azelastine (ASTELIN) 0.1 % nasal spray, Place 2 sprays into both nostrils 2 (two) times daily. Use in each nostril as directed, Disp: 30 mL, Rfl: 12    busPIRone (BUSPAR) 7.5 MG tablet, Take 1 tablet (7.5 mg total) by mouth 3 (three) times daily., Disp: 90 tablet, Rfl: 2   chlorthalidone (HYGROTON) 25 MG  tablet, Take 0.5 tablets (12.5 mg total) by mouth daily., Disp: 45 tablet, Rfl: 3   escitalopram (LEXAPRO) 20 MG tablet, Take 1 tablet (20 mg total) by mouth daily., Disp: 90 tablet, Rfl: 2   fexofenadine (ALLEGRA) 180 MG tablet, TAKE 1 TABLET(180 MG) BY MOUTH DAILY, Disp: 90 tablet, Rfl: 1   omeprazole (PRILOSEC) 40 MG capsule, Take 1 capsule (40 mg total) by mouth daily., Disp: 30 capsule, Rfl: 3  Allergies  Allergen Reactions   Bee Venom     I personally reviewed active problem list, medication list, allergies, notes from last encounter with the patient/caregiver today.   ROS Constitutional: Negative for fever or weight change.  Respiratory: Negative for cough and shortness of breath.   Cardiovascular: Negative for chest pain or palpitations.  Gastrointestinal: Negative for abdominal pain, no bowel changes.  Musculoskeletal: Negative for gait problem or joint swelling.  Skin: Negative for rash.  Neurological: Negative for dizziness or headache.  No other specific complaints in a complete review of systems (except as listed in HPI above).   Objective  Virtual encounter, vitals not obtained.  There is no height or weight on file to calculate BMI.  Physical Exam Awake, alert and oriented speaking in complete sentences  Results for orders placed or performed in visit on 05/17/22 (from the past 72 hour(s))  FSH     Status: None   Collection Time: 05/17/22 11:57 AM  Result Value Ref Range   FSH 17.6 mIU/mL    Comment:                     Adult Female:                       Follicular phase      3.5 -  12.5                       Ovulation phase       4.7 -  21.5                       Luteal phase          1.7 -   7.7                       Postmenopausal       25.8 - 134.8   Estradiol     Status: None   Collection Time:  05/17/22 11:57 AM  Result Value Ref Range   Estradiol 33.9 pg/mL    Comment:                     Adult Female:                       Follicular phase   [id number] -   166.0                       Ovulation phase    85.8 -   498.0                       Luteal phase       43.8 -   211.0                       Postmenopausal     <6.0 -  54.7                     Pregnancy                       1st trimester     215.0 - >4300.0 Roche ECLIA methodology     PHQ2/9:    05/19/2022    8:03 AM 12/29/2021   11:15 AM 10/29/2021    9:50 AM 10/01/2021    3:09 PM 04/29/2021    9:45 AM  Depression screen PHQ 2/9  Decreased Interest 1 0 1 3 2   Down, Depressed, Hopeless 1 0 2 3 2   PHQ - 2 Score 2 0 3 6 4   Altered sleeping 1 0 0 0 2  Tired, decreased energy 1 0 0 1 1  Change in appetite 1 0 0 0 0  Feeling bad or failure about yourself  1 0 2 1 1   Trouble concentrating 1 0 1 0 1  Moving slowly or fidgety/restless 0 0 0 0 2  Suicidal thoughts 0 0 0 0 0  PHQ-9 Score 7 0 6 8 11   Difficult doing work/chores Somewhat difficult Not difficult at all Somewhat difficult Somewhat difficult Somewhat difficult   PHQ-2/9 Result is positive.    Fall Risk:    05/19/2022    8:03 AM 12/29/2021   11:15 AM 10/29/2021    9:49 AM 10/01/2021    3:08 PM 04/29/2021    9:45 AM  Fall Risk   Falls in the past year? 0 0 0 0 0  Number falls in past yr: 0 0 0 0 0  Injury with Fall? 0 0 0 0 0  Risk for fall due to :   No Fall Risks No Fall Risks   Follow up Falls evaluation completed Falls evaluation completed Falls prevention discussed Falls prevention discussed Falls evaluation completed     Assessment & Plan  1. Essential hypertension Continue current treatment If blood pressure continues to be on lower end, we may be able to discontinue blood pressure medication.  - COMPLETE METABOLIC PANEL WITH GFR - CBC with Differential/Platelet - chlorthalidone (HYGROTON) 25 MG tablet; Take 0.5 tablets (12.5 mg total) by mouth daily.   Dispense: 45 tablet; Refill: 3  2. Hyperlipidemia, unspecified hyperlipidemia type Continue current treatment - Lipid panel - COMPLETE METABOLIC PANEL WITH GFR - atorvastatin (LIPITOR) 40 MG tablet; Take 1 tablet (40 mg total) by mouth daily.  Dispense: 90 tablet; Refill: 3  3. Gastroesophageal reflux disease without esophagitis Continue current treatment - omeprazole (PRILOSEC) 40 MG capsule; Take 1 capsule (40 mg total) by mouth daily.  Dispense: 30 capsule; Refill: 3  5. Anxiety  - escitalopram (LEXAPRO) 20 MG tablet; Take 1 tablet (20 mg total) by mouth daily.  Dispense: 90 tablet; Refill: 2 - busPIRone (BUSPAR) 7.5 MG tablet; Take 1 tablet (7.5 mg total) by mouth 3 (three) times daily.  Dispense: 90 tablet; Refill: 2  6. Current moderate episode of major depressive disorder without prior episode (HCC)  - escitalopram (LEXAPRO) 20 MG tablet; Take 1 tablet (20 mg total) by mouth daily.  Dispense: 90 tablet; Refill: 2 - busPIRone (BUSPAR) 7.5 MG tablet; Take 1 tablet (7.5 mg total) by mouth 3 (three) times daily.  Dispense: 90 tablet; Refill: 2   I discussed the assessment and treatment plan with the patient. The patient was provided an opportunity to ask questions and all were answered. The patient agreed with the plan  and demonstrated an understanding of the instructions.  The patient was advised to call back or seek an in-person evaluation if the symptoms worsen or if the condition fails to improve as anticipated.  I provided 30 minutes of non-face-to-face time during this encounter.

## 2022-05-19 NOTE — Telephone Encounter (Signed)
Pt's EMB was neg; GYN u/s with EM=18 mm and RTO cyst. FSH and estradiol not suggestive of menopause. Will treat with Rx provera to see if any withdrawal bleed. Pt to f/u with or without bleeding and will go from there.

## 2022-05-23 ENCOUNTER — Other Ambulatory Visit: Payer: Self-pay | Admitting: Obstetrics and Gynecology

## 2022-05-23 DIAGNOSIS — N912 Amenorrhea, unspecified: Secondary | ICD-10-CM

## 2022-05-24 LAB — LIPID PANEL
Cholesterol: 201 mg/dL — ABNORMAL HIGH (ref <200)
HDL: 70 mg/dL (ref >=50)
LDL Cholesterol (Calc): 106 mg/dL (calc) — ABNORMAL HIGH
Non-HDL Cholesterol (Calc): 131 mg/dL (calc) — ABNORMAL HIGH (ref <130)
Total CHOL/HDL Ratio: 2.9 (calc) (ref <5.0)
Triglycerides: 141 mg/dL (ref <150)

## 2022-05-24 LAB — CBC WITH DIFFERENTIAL/PLATELET
Absolute Monocytes: 351 cells/uL (ref 200–950)
Basophils Absolute: 49 cells/uL (ref 0–200)
Basophils Relative: 0.9 %
Eosinophils Absolute: 81 cells/uL (ref 15–500)
Eosinophils Relative: 1.5 %
HCT: 40.4 % (ref 35.0–45.0)
Hemoglobin: 13.2 g/dL (ref 11.7–15.5)
Lymphs Abs: 1453 cells/uL (ref 850–3900)
MCH: 29.6 pg (ref 27.0–33.0)
MCHC: 32.7 g/dL (ref 32.0–36.0)
MCV: 90.6 fL (ref 80.0–100.0)
MPV: 12.1 fL (ref 7.5–12.5)
Monocytes Relative: 6.5 %
Neutro Abs: 3467 cells/uL (ref 1500–7800)
Neutrophils Relative %: 64.2 %
Platelets: 199 10*3/uL (ref 140–400)
RBC: 4.46 10*6/uL (ref 3.80–5.10)
RDW: 12.3 % (ref 11.0–15.0)
Total Lymphocyte: 26.9 %
WBC: 5.4 10*3/uL (ref 3.8–10.8)

## 2022-05-24 LAB — COMPLETE METABOLIC PANEL WITH GFR
AG Ratio: 1.6 (calc) (ref 1.0–2.5)
ALT: 15 U/L (ref 6–29)
AST: 17 U/L (ref 10–35)
Albumin: 4.1 g/dL (ref 3.6–5.1)
Alkaline phosphatase (APISO): 82 U/L (ref 37–153)
BUN: 17 mg/dL (ref 7–25)
CO2: 31 mmol/L (ref 20–32)
Calcium: 9.1 mg/dL (ref 8.6–10.4)
Chloride: 104 mmol/L (ref 98–110)
Creat: 0.98 mg/dL (ref 0.50–1.03)
Globulin: 2.5 g/dL (calc) (ref 1.9–3.7)
Glucose, Bld: 97 mg/dL (ref 65–99)
Potassium: 4 mmol/L (ref 3.5–5.3)
Sodium: 140 mmol/L (ref 135–146)
Total Bilirubin: 0.3 mg/dL (ref 0.2–1.2)
Total Protein: 6.6 g/dL (ref 6.1–8.1)
eGFR: 69 mL/min/{1.73_m2} (ref >=60)

## 2022-06-02 ENCOUNTER — Telehealth: Payer: Self-pay

## 2022-06-02 NOTE — Telephone Encounter (Signed)
Pt called triage wanting to let Helmut Muster Copland know that the 7 day medication worked.

## 2022-06-03 ENCOUNTER — Other Ambulatory Visit: Payer: Self-pay | Admitting: Obstetrics and Gynecology

## 2022-06-03 DIAGNOSIS — N912 Amenorrhea, unspecified: Secondary | ICD-10-CM

## 2022-06-03 MED ORDER — MEDROXYPROGESTERONE ACETATE 10 MG PO TABS: 10.0000 mg | ORAL_TABLET | Freq: Every day | ORAL | 0 refills | Status: DC

## 2022-06-03 NOTE — Progress Notes (Signed)
Rx provera Q3 months if no spontaneous menses. Pt isn't postmenopausal.

## 2022-06-03 NOTE — Telephone Encounter (Signed)
Pls let pt know she is not postmenopausal and needs to take provera every 3 months if no bleeding on her own. If she takes medicine and doesn't have a period, then pt to let me know. Rx RF eRxd. F/u prn

## 2022-06-06 NOTE — Telephone Encounter (Signed)
Pt aware. She wants to let you know this past Saturday she had super heavy bleeding, for 4 hours, she changed a full pad two times in one hour. She is still bleeding as of today, but definitely not like Saturday. No abnormal pain. Advised our heavy vag bleeding protocol.

## 2022-06-06 NOTE — Telephone Encounter (Signed)
Called pt, no answer, [facility]. 

## 2022-06-07 NOTE — Telephone Encounter (Signed)
Bleeding due to all the blood that needed to come out since not having regular menses. Should be better if does provera every 3 months. F/u prn.

## 2022-06-07 NOTE — Telephone Encounter (Signed)
Pt aware.

## 2022-09-02 ENCOUNTER — Other Ambulatory Visit: Payer: Self-pay | Admitting: Obstetrics and Gynecology

## 2022-09-02 DIAGNOSIS — N912 Amenorrhea, unspecified: Secondary | ICD-10-CM

## 2022-09-16 ENCOUNTER — Ambulatory Visit (INDEPENDENT_AMBULATORY_CARE_PROVIDER_SITE_OTHER): Payer: Commercial Managed Care - PPO

## 2022-09-16 DIAGNOSIS — Z23 Encounter for immunization: Secondary | ICD-10-CM

## 2022-09-16 NOTE — Progress Notes (Signed)
Patient tolerated vaccine well with no immediate adverse reaction noted at this time.

## 2022-09-21 ENCOUNTER — Other Ambulatory Visit: Payer: Self-pay | Admitting: Nurse Practitioner

## 2022-09-21 DIAGNOSIS — K219 Gastro-esophageal reflux disease without esophagitis: Secondary | ICD-10-CM

## 2023-01-02 ENCOUNTER — Telehealth (INDEPENDENT_AMBULATORY_CARE_PROVIDER_SITE_OTHER): Payer: Self-pay | Admitting: Nurse Practitioner

## 2023-01-02 DIAGNOSIS — J0141 Acute recurrent pansinusitis: Secondary | ICD-10-CM

## 2023-01-02 DIAGNOSIS — B379 Candidiasis, unspecified: Secondary | ICD-10-CM

## 2023-01-02 DIAGNOSIS — T3695XA Adverse effect of unspecified systemic antibiotic, initial encounter: Secondary | ICD-10-CM

## 2023-01-02 MED ORDER — AMOXICILLIN-POT CLAVULANATE 875-125 MG PO TABS: 1.0000 | ORAL_TABLET | Freq: Two times a day (BID) | ORAL | 0 refills | Status: DC

## 2023-01-02 MED ORDER — FLUCONAZOLE 150 MG PO TABS: 150.0000 mg | ORAL_TABLET | ORAL | 0 refills | Status: DC | PRN

## 2023-01-02 NOTE — Progress Notes (Signed)
Name: Cassidy Collins   MRN: [id number]    DOB: 10/28/1966   Date:01/02/2023       Progress Note  Subjective  Chief Complaint  Chief Complaint  Patient presents with   Nasal Congestion    Onset for 3 weeks. Has taken 3 COVID AT HOME TEST all negative   Headache    I connected with  Paulita Cradle  on 01/02/23 at 10:00 am by a video enabled telemedicine application and verified that I am speaking with the correct person using two identifiers.  I discussed the limitations of evaluation and management by telemedicine and the availability of in person appointments. The patient expressed understanding and agreed to proceed with a virtual visit  Staff also discussed with the patient that there may be a patient responsible charge related to this service. Patient Location: home Provider Location: [facility] Additional Individuals present: alone  HPI  Sinus infection:  patient reports she has had nasal congestion and headache for three weeks. She says she has facial tenderness, and that it switches sides sometimes on the right and sometimes on the left.  She says she has a thick yellow discharge.  She says she has taken three covid tests at home and they were all negative.  She is currently taking astelin,mucinex, advil cold and sinus and allegra.  She denies any fever, cough, shortness of breath. Will send in Augmentin for sinus infection. Patient reports that sometimes she gets a yeast infection with antibiotics sent in diflucan.   Patient Active Problem List   Diagnosis Date Noted   COVID 11/11/2020   Early menopause 10/27/2019   Current moderate episode of major depressive disorder without prior episode ([place]) 01/17/2019   Anxiety 01/17/2019   Essential hypertension 01/17/2019   Family history of colon cancer    Family history of ovarian cancer    Hyperlipidemia 09/01/2017   Overweight (BMI 25.0-29.9) 09/01/2017   Other allergic rhinitis 09/01/2017   Gastroesophageal reflux  disease without esophagitis 09/01/2017    Social History   Tobacco Use   Smoking status: Never   Smokeless tobacco: Never  Substance Use Topics   Alcohol use: Yes    Alcohol/week: 0.0 standard drinks of alcohol    Comment: occasionally on weekend     Current Outpatient Medications:    atorvastatin (LIPITOR) 40 MG tablet, Take 1 tablet (40 mg total) by mouth daily., Disp: 90 tablet, Rfl: 3   azelastine (ASTELIN) 0.1 % nasal spray, Place 2 sprays into both nostrils 2 (two) times daily. Use in each nostril as directed, Disp: 30 mL, Rfl: 12   busPIRone (BUSPAR) 7.5 MG tablet, Take 1 tablet (7.5 mg total) by mouth 3 (three) times daily., Disp: 90 tablet, Rfl: 2   chlorthalidone (HYGROTON) 25 MG tablet, Take 0.5 tablets (12.5 mg total) by mouth daily., Disp: 45 tablet, Rfl: 3   escitalopram (LEXAPRO) 20 MG tablet, Take 1 tablet (20 mg total) by mouth daily., Disp: 90 tablet, Rfl: 2   fexofenadine (ALLEGRA) 180 MG tablet, TAKE 1 TABLET(180 MG) BY MOUTH DAILY, Disp: 90 tablet, Rfl: 1   omeprazole (PRILOSEC) 40 MG capsule, TAKE ONE CAPSULE BY MOUTH DAILY, Disp: 90 capsule, Rfl: 3   medroxyPROGESTERone (PROVERA) 10 MG tablet, Take 1 tablet (10 mg total) by mouth daily for 7 days. Every 3 months if no period, Disp: 21 tablet, Rfl: 0  Allergies  Allergen Reactions   Bee Venom     I personally reviewed active problem list, medication  list, allergies, notes from last encounter with the patient/caregiver today.  ROS  Constitutional: Negative for fever or weight change.  HEENT: positive for nasal congestion Respiratory: Negative for cough and shortness of breath.   Cardiovascular: Negative for chest pain or palpitations.  Gastrointestinal: Negative for abdominal pain, no bowel changes.  Musculoskeletal: Negative for gait problem or joint swelling.  Skin: Negative for rash.  Neurological: Negative for dizziness , positive for  headache.  No other specific complaints in a complete review of  systems (except as listed in HPI above).   Objective  Virtual encounter, vitals not obtained.  There is no height or weight on file to calculate BMI.  Nursing Note and Vital Signs reviewed.  Physical Exam  Awake, alert and oriented, speaking in complete sentences  No results found for this or any previous visit (from the past 72 hour(s)).  Assessment & Plan  1. Acute recurrent pansinusitis Continue taking current treatment Push fluids Can use a humidifier - amoxicillin-clavulanate (AUGMENTIN) 875-125 MG tablet; Take 1 tablet by mouth 2 (two) times daily.  Dispense: 20 tablet; Refill: 0  2. Antibiotic-induced yeast infection  - fluconazole (DIFLUCAN) 150 MG tablet; Take 1 tablet (150 mg total) by mouth every 3 (three) days as needed (for vaginal itching/yeast infection sx).  Dispense: 2 tablet; Refill: 0   -Red flags and when to present for emergency care or RTC including fever >101.45F, chest pain, shortness of breath, new/worsening/un-resolving symptoms,  reviewed with patient at time of visit. Follow up and care instructions discussed and provided in AVS. - I discussed the assessment and treatment plan with the patient. The patient was provided an opportunity to ask questions and all were answered. The patient agreed with the plan and demonstrated an understanding of the instructions.  I provided 15 minutes of non-face-to-face time during this encounter.  Bo Merino, FNP

## 2023-02-28 NOTE — Progress Notes (Unsigned)
PCP: Bo Merino, FNP   No chief complaint on file.   HPI:      Cassidy Collins is a 57 y.o. G1P1001 who LMP was No LMP recorded. Patient is postmenopausal., presents today for her annual examination.  Her menses are absent due to premature menopause (age 72). She does not have PMB but had 1 episode brown spot with wiping 1 mo ago, no other bleeding. No urin or GI sx; wasn't recently sex active before. No sx since.  Now having pink/red blood after sex, lasting a day or so; as well as light bleeding after running or lifting weights, lasting about 12 hrs.  Pt's EMB was neg; GYN u/s with EM=18 mm and RTO cyst. FSH and estradiol not suggestive of menopause. Will treat with Rx provera to see if any withdrawal bleed. Pt to f/u with or without bleeding and will go from there.       Sex activity: single partner, contraception - post menopausal status. She does not have vaginal dryness/pain/bleeding.  Last Pap: 12/21/21  Results were: no abnormalities /neg HPV DNA.  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  Last mammogram: 02/01/22 Results were: normal--routine follow-up in 12 months.  There is a FH of breast cancer in her mat aunt and ovarian cancer in her MGM. Pt's sister is BRCA neg. Pt has declined genetic testing several times in past. The patient does self-breast exams.  Colonoscopy: 2019 Repeat due after 5 years. Father with colon cancer.  DEXA: 3/23 at [facility] with osteopenia hip, normal spine  Tobacco use: The patient denies current or previous tobacco use. Alcohol use: social drinker  No drug use. Exercise: moderately active  She does get adequate calcium and Vitamin D in her diet.  Labs with PCP.   Past Medical History:  Diagnosis Date   Anxiety 2019   Family history of colon cancer    Family history of ovarian cancer    GERD (gastroesophageal reflux disease)    History of abnormal cervical Pap smear    positive  HRHPV   History of mammogram 10/04/2016   neg   Hyperlipidemia     Past Surgical History:  Procedure Laterality Date   COLONOSCOPY  2010   Dr Adair Laundry   COLONOSCOPY W/ BIOPSIES  03/02/2018   hyperplastic polyp-repeat in 5 years/ Dr Alice Reichert   DILATION AND CURETTAGE OF UTERUS     LEEP  2005   for postcoital spotting/ path-B9   TUBAL LIGATION      Family History  Problem Relation Age of Onset   Colon cancer Father 20   Hypertension Father    Breast cancer Maternal Aunt 69       has contact   Ovarian cancer Maternal Grandmother 34   Hypertension Mother    Hypertension Sister     Social History   Socioeconomic History   Marital status: Married    Spouse name: Not on file   Number of children: 1   Years of education: Not on file   Highest education level: Not on file  Occupational History   Occupation: Chiropodist  Tobacco Use   Smoking status: Never   Smokeless tobacco: Never  Vaping Use   Vaping Use: Never used  Substance and Sexual Activity   Alcohol use: Yes    Alcohol/week: 0.0 standard drinks of alcohol    Comment: occasionally on weekend   Drug use:  No   Sexual activity: Yes    Partners: Male    Birth control/protection: Post-menopausal  Other Topics Concern   Not on file  Social History Narrative   Not on file   Social Determinants of Health   Financial Resource Strain: Not on file  Food Insecurity: Not on file  Transportation Needs: Not on file  Physical Activity: Not on file  Stress: Not on file  Social Connections: Not on file  Intimate Partner Violence: Not on file    Outpatient Medications Prior to Visit  Medication Sig Dispense Refill   amoxicillin-clavulanate (AUGMENTIN) 875-125 MG tablet Take 1 tablet by mouth 2 (two) times daily. 20 tablet 0   atorvastatin (LIPITOR) 40 MG tablet Take 1 tablet (40 mg total) by mouth daily. 90 tablet 3   azelastine (ASTELIN) 0.1 % nasal spray Place 2 sprays into both nostrils 2 (two) times  daily. Use in each nostril as directed 30 mL 12   busPIRone (BUSPAR) 7.5 MG tablet Take 1 tablet (7.5 mg total) by mouth 3 (three) times daily. 90 tablet 2   chlorthalidone (HYGROTON) 25 MG tablet Take 0.5 tablets (12.5 mg total) by mouth daily. 45 tablet 3   escitalopram (LEXAPRO) 20 MG tablet Take 1 tablet (20 mg total) by mouth daily. 90 tablet 2   fexofenadine (ALLEGRA) 180 MG tablet TAKE 1 TABLET(180 MG) BY MOUTH DAILY 90 tablet 1   fluconazole (DIFLUCAN) 150 MG tablet Take 1 tablet (150 mg total) by mouth every 3 (three) days as needed (for vaginal itching/yeast infection sx). 2 tablet 0   medroxyPROGESTERone (PROVERA) 10 MG tablet Take 1 tablet (10 mg total) by mouth daily for 7 days. Every 3 months if no period 21 tablet 0   omeprazole (PRILOSEC) 40 MG capsule TAKE ONE CAPSULE BY MOUTH DAILY 90 capsule 3   No facility-administered medications prior to visit.       ROS:  Review of Systems  Constitutional:  Negative for fatigue, fever and unexpected weight change.  Respiratory:  Negative for cough, shortness of breath and wheezing.   Cardiovascular:  Negative for chest pain, palpitations and leg swelling.  Gastrointestinal:  Negative for blood in stool, constipation, diarrhea, nausea and vomiting.  Endocrine: Negative for cold intolerance, heat intolerance and polyuria.  Genitourinary:  Negative for dyspareunia, dysuria, flank pain, frequency, genital sores, hematuria, menstrual problem, pelvic pain, urgency, vaginal bleeding, vaginal discharge and vaginal pain.  Musculoskeletal:  Negative for back pain, joint swelling and myalgias.  Skin:  Negative for rash.  Neurological:  Negative for dizziness, syncope, light-headedness, numbness and headaches.  Hematological:  Negative for adenopathy.  Psychiatric/Behavioral:  Negative for agitation, confusion, sleep disturbance and suicidal ideas. The patient is not nervous/anxious.   BREAST: No symptoms    Objective: There were no  vitals taken for this visit.   Physical Exam Constitutional:      Appearance: She is well-developed.  Genitourinary:     Vulva normal.     Right Labia: No rash, tenderness or lesions.    Left Labia: No tenderness, lesions or rash.    No vaginal discharge, erythema, tenderness or bleeding.      Right Adnexa: not tender and no mass present.    Left Adnexa: not tender and no mass present.    No cervical motion tenderness, friability or polyp.     Uterus is not enlarged or tender.  Breasts:    Right: No mass, nipple discharge, skin change or tenderness.     Left:  No mass, nipple discharge, skin change or tenderness.  Neck:     Thyroid: No thyromegaly.  Cardiovascular:     Rate and Rhythm: Normal rate and regular rhythm.     Heart sounds: Normal heart sounds. No murmur heard. Pulmonary:     Effort: Pulmonary effort is normal.     Breath sounds: Normal breath sounds.  Abdominal:     Palpations: Abdomen is soft.     Tenderness: There is no abdominal tenderness. There is no guarding or rebound.  Musculoskeletal:        General: Normal range of motion.     Cervical back: Normal range of motion.  Lymphadenopathy:     Cervical: No cervical adenopathy.  Neurological:     General: No focal deficit present.     Mental Status: She is alert and oriented to person, place, and time.     Cranial Nerves: No cranial nerve deficit.  Skin:    General: Skin is warm and dry.  Psychiatric:        Mood and Affect: Mood normal.        Behavior: Behavior normal.        Thought Content: Thought content normal.        Judgment: Judgment normal.  Vitals reviewed.     Assessment/Plan: Encounter for annual routine gynecological examination  Cervical cancer screening - Plan: Cytology - PAP  Screening for HPV (human papillomavirus) - Plan: Cytology - PAP  Encounter for screening mammogram for malignant neoplasm of breast - Plan: MM 3D SCREEN BREAST BILATERAL; pt to sheds mammo  Early menopause  - Plan: DG Bone Density; pt to sheds DEXA with mammo; cont ca/Vit D/exercise  Family history of ovarian cancer--MyRisk testing discussed. Pt to consider and f/u prn.   Screening for osteoporosis - Plan: DG Bone Density  PMB--1 episode brown with wiping, but nothing else. Unsure if PMB vs other. Pt to f/u if sx recur for GYN u/s.         GYN counsel breast self exam, mammography screening, menopause, adequate intake of calcium and vitamin D, diet and exercise    F/U  No follow-ups on file.  Leshay Desaulniers B. Maxine Huynh, PA-C 02/28/2023 4:43 PM

## 2023-03-02 ENCOUNTER — Encounter: Payer: Self-pay | Admitting: Obstetrics and Gynecology

## 2023-03-02 ENCOUNTER — Ambulatory Visit (INDEPENDENT_AMBULATORY_CARE_PROVIDER_SITE_OTHER): Payer: BC Managed Care – PPO | Admitting: Obstetrics and Gynecology

## 2023-03-02 VITALS — BP 110/80 | Ht 65.0 in | Wt 171.0 lb

## 2023-03-02 DIAGNOSIS — E28319 Asymptomatic premature menopause: Secondary | ICD-10-CM

## 2023-03-02 DIAGNOSIS — Z01419 Encounter for gynecological examination (general) (routine) without abnormal findings: Secondary | ICD-10-CM | POA: Diagnosis not present

## 2023-03-02 DIAGNOSIS — N912 Amenorrhea, unspecified: Secondary | ICD-10-CM

## 2023-03-02 DIAGNOSIS — Z1231 Encounter for screening mammogram for malignant neoplasm of breast: Secondary | ICD-10-CM

## 2023-03-02 DIAGNOSIS — Z8041 Family history of malignant neoplasm of ovary: Secondary | ICD-10-CM

## 2023-03-02 DIAGNOSIS — N950 Postmenopausal bleeding: Secondary | ICD-10-CM

## 2023-03-02 DIAGNOSIS — Z1211 Encounter for screening for malignant neoplasm of colon: Secondary | ICD-10-CM

## 2023-03-02 MED ORDER — MEDROXYPROGESTERONE ACETATE 10 MG PO TABS: 10.0000 mg | ORAL_TABLET | Freq: Every day | ORAL | 0 refills | Status: AC

## 2023-03-02 NOTE — Patient Instructions (Addendum)
I value your feedback and you entrusting us with your care. If you get a [facility] [facility] patient survey, I would appreciate you taking the time to let us know about your experience today. Thank you!  [facility]: [phone]      

## 2023-03-13 ENCOUNTER — Other Ambulatory Visit: Payer: Self-pay | Admitting: Nurse Practitioner

## 2023-03-13 DIAGNOSIS — J302 Other seasonal allergic rhinitis: Secondary | ICD-10-CM

## 2023-03-13 NOTE — Telephone Encounter (Signed)
Patient called making sure pharmacy sent request for fexofenadine (ALLEGRA) 180 MG tablet

## 2023-03-14 NOTE — Telephone Encounter (Signed)
Requested medication (s) are due for refill today - expired Rx  Requested medication (s) are on the active medication list -yes  Future visit scheduled -no  Last refill: 10/29/21 #90 1RF  Notes to clinic: expired Rx  Requested Prescriptions  Pending Prescriptions Disp Refills   fexofenadine (ALLEGRA) 180 MG tablet [Pharmacy Med Name: FEXOFENADINE HCL 180 MG TABLET] 90 tablet 1    Sig: TAKE ONE TABLET BY MOUTH DAILY     Ear, Nose, and Throat:  Antihistamines Passed - 03/13/2023 10:10 AM      Passed - Valid encounter within last 12 months    Recent Outpatient Visits           2 months ago Acute recurrent pansinusitis   [facility] [name], FNP   9 months ago Essential hypertension   [facility] [name], FNP   1 year ago Acute non-recurrent pansinusitis   [facility] [name], FNP   1 year ago Essential hypertension   [facility] Della Goo F, FNP   1 year ago Current moderate episode of major depressive disorder without prior episode [facility])   [place] [facility] Danelle Berry, PA-C                 Requested Prescriptions  Pending Prescriptions Disp Refills   fexofenadine (ALLEGRA) 180 MG tablet [Pharmacy Med Name: FEXOFENADINE HCL 180 MG TABLET] 90 tablet 1    Sig: TAKE ONE TABLET BY MOUTH DAILY     Ear, Nose, and Throat:  Antihistamines Passed - 03/13/2023 10:10 AM      Passed - Valid encounter within last 12 months    Recent Outpatient Visits           2 months ago Acute recurrent pansinusitis   [facility] [name], FNP   9 months ago Essential hypertension   [facility] [name], FNP   1 year ago Acute non-recurrent pansinusitis   [facility] [name], FNP   1 year ago Essential hypertension   [facility]  [facility] Della Goo F, FNP   1 year ago Current moderate episode of major depressive disorder without prior episode [facility])   [facility] Danelle Berry, [place]

## 2023-03-23 ENCOUNTER — Ambulatory Visit
Admission: RE | Admit: 2023-03-23 | Discharge: 2023-03-23 | Disposition: A | Discharge status: Home / Self Care | Payer: BC Managed Care – PPO | Source: Ambulatory Visit | Attending: Obstetrics and Gynecology | Admitting: Obstetrics and Gynecology

## 2023-03-23 DIAGNOSIS — Z1231 Encounter for screening mammogram for malignant neoplasm of breast: Secondary | ICD-10-CM | POA: Insufficient documentation

## 2023-04-01 ENCOUNTER — Other Ambulatory Visit: Payer: Self-pay | Admitting: Obstetrics and Gynecology

## 2023-04-01 DIAGNOSIS — N912 Amenorrhea, unspecified: Secondary | ICD-10-CM

## 2023-04-04 ENCOUNTER — Other Ambulatory Visit: Payer: Self-pay | Admitting: Nurse Practitioner

## 2023-04-04 ENCOUNTER — Other Ambulatory Visit: Payer: Self-pay | Admitting: Obstetrics and Gynecology

## 2023-04-04 DIAGNOSIS — B379 Candidiasis, unspecified: Secondary | ICD-10-CM

## 2023-04-04 DIAGNOSIS — N912 Amenorrhea, unspecified: Secondary | ICD-10-CM

## 2023-04-04 DIAGNOSIS — J0141 Acute recurrent pansinusitis: Secondary | ICD-10-CM

## 2023-04-04 NOTE — Telephone Encounter (Signed)
Requested medication (s) are due for refill today:   Requested medication (s) are on the active medication list: Diflucan  Last refill:  01/02/23  Future visit scheduled: No  Notes to clinic:  See request.    Requested Prescriptions  Pending Prescriptions Disp Refills   amoxicillin-clavulanate (AUGMENTIN) 875-125 MG tablet [Pharmacy Med Name: AMOX-CLAV 875-125 MG TABLET] 20 tablet 0    Sig: TAKE 1 TABLET BY MOUTH TWICE A DAY     Off-Protocol Failed - 04/04/2023 10:36 AM      Failed - Medication not assigned to a protocol, review manually.      Passed - Valid encounter within last 12 months    Recent Outpatient Visits           3 months ago Acute recurrent pansinusitis   [facility] [name], FNP   10 months ago Essential hypertension   [facility] [name], FNP   1 year ago Acute non-recurrent pansinusitis   [facility] [name], FNP   1 year ago Essential hypertension   [facility] Della Goo F, FNP   1 year ago Current moderate episode of major depressive disorder without prior episode [facility])   [place] [facility] Danelle Berry, PA-C               fluconazole (DIFLUCAN) 150 MG tablet [Pharmacy Med Name: FLUCONAZOLE 150 MG TABLET] 2 tablet 0    Sig: TAKE 1 TABLET BY MOUTH EVERY THREE DAYS AS NEEDED     Off-Protocol Failed - 04/04/2023 10:36 AM      Failed - Medication not assigned to a protocol, review manually.      Passed - Valid encounter within last 12 months    Recent Outpatient Visits           3 months ago Acute recurrent pansinusitis   [facility] [name], FNP   10 months ago Essential hypertension   [facility] [name], FNP   1 year ago Acute non-recurrent pansinusitis   [facility] [name], FNP   1 year  ago Essential hypertension   [facility] Della Goo F, FNP   1 year ago Current moderate episode of major depressive disorder without prior episode [facility])   [facility] Danelle Berry, [place]

## 2023-04-07 NOTE — Telephone Encounter (Signed)
Pt states she does not need these meds but did schedule an appt

## 2023-04-19 NOTE — Progress Notes (Unsigned)
There were no vitals taken for this visit.   Subjective:    Patient ID: Cassidy Collins, female    DOB: 1966-02-17, 57 y.o.   MRN: [id number]  HPI: Cassidy Collins is a 57 y.o. female  No chief complaint on file.  HTN:she is currently taking chlorthalidone 12.5 mg daily. Her blood pressure today is ***.  She denies any chest pain, shortness of breath, headaches or blurred vision.      Hyperlipidemia: she currently takes atorvastatin 40 mg daily.  She denies any myalgia.  Her last LDL was 106 on 05/23/2022.  The 10-year ASCVD risk score (Arnett DK, et al., 2019) is: 2.3%   Values used to calculate the score:     Age: 54 years     Sex: Female     Is Non-Hispanic African American: Yes     Diabetic: No     Tobacco smoker: No     Systolic Blood Pressure: 110 mmHg     Is BP treated: Yes     HDL Cholesterol: 70 mg/dL     Total Cholesterol: 201 mg/dL   GERD: she currently takes omeprazole 40 mg daily.  She reports that her acid reflux is ***   Depression/anxiety:she is currently taking lexapro 20 mg daily and buspar 7.5 mg three times a day.  She reports ***     01/02/2023    9:53 AM 05/19/2022    8:03 AM 12/29/2021   11:15 AM 10/29/2021    9:50 AM 10/01/2021    3:09 PM  Depression screen PHQ 2/9  Decreased Interest 0 1 0 1 3  Down, Depressed, Hopeless 0 1 0 2 3  PHQ - 2 Score 0 2 0 3 6  Altered sleeping 0 1 0 0 0  Tired, decreased energy 0 1 0 0 1  Change in appetite 0 1 0 0 0  Feeling bad or failure about yourself  0 1 0 2 1  Trouble concentrating 0 1 0 1 0  Moving slowly or fidgety/restless 0 0 0 0 0  Suicidal thoughts 0 0 0 0 0  PHQ-9 Score 0 7 0 6 8  Difficult doing work/chores Not difficult at all Somewhat difficult Not difficult at all Somewhat difficult Somewhat difficult    Relevant past medical, surgical, family and social history reviewed and updated as indicated. Interim medical history since our last visit reviewed. Allergies and medications reviewed  and updated.  Review of Systems  Constitutional: Negative for fever or weight change.  Respiratory: Negative for cough and shortness of breath.   Cardiovascular: Negative for chest pain or palpitations.  Gastrointestinal: Negative for abdominal pain, no bowel changes.  Musculoskeletal: Negative for gait problem or joint swelling.  Skin: Negative for rash.  Neurological: Negative for dizziness or headache.  No other specific complaints in a complete review of systems (except as listed in HPI above).      Objective:    There were no vitals taken for this visit.  Wt Readings from Last 3 Encounters:  03/02/23 171 lb (77.6 kg)  05/17/22 172 lb (78 kg)  04/26/22 173 lb (78.5 kg)    Physical Exam  Constitutional: Patient appears well-developed and well-nourished. Obese *** No distress.  HEENT: head atraumatic, normocephalic, pupils equal and reactive to light, ears ***, neck supple, throat within normal limits Cardiovascular: Normal rate, regular rhythm and normal heart sounds.  No murmur heard. No BLE edema. Pulmonary/Chest: Effort normal and breath sounds normal. No respiratory  distress. Abdominal: Soft.  There is no tenderness. Psychiatric: Patient has a normal mood and affect. behavior is normal. Judgment and thought content normal.  Results for orders placed or performed in visit on 05/19/22  Lipid panel  Result Value Ref Range   Cholesterol 201 (H) <200 mg/dL   HDL 70 > OR = 50 mg/dL   Triglycerides 409 <811 mg/dL   LDL Cholesterol (Calc) 106 (H) mg/dL (calc)   Total CHOL/HDL Ratio 2.9 <5.0 (calc)   Non-HDL Cholesterol (Calc) 131 (H) <130 mg/dL (calc)  COMPLETE METABOLIC PANEL WITH GFR  Result Value Ref Range   Glucose, Bld 97 65 - 99 mg/dL   BUN 17 7 - 25 mg/dL   Creat 9.14 7.82 - 9.56 mg/dL   eGFR 69 > OR = 60 OZ/HYQ/6.57Q4   BUN/Creatinine Ratio NOT APPLICABLE 6 - 22 (calc)   Sodium 140 135 - 146 mmol/L   Potassium 4.0 3.5 - 5.3 mmol/L   Chloride 104 98 - 110 mmol/L    CO2 31 20 - 32 mmol/L   Calcium 9.1 8.6 - 10.4 mg/dL   Total Protein 6.6 6.1 - 8.1 g/dL   Albumin 4.1 3.6 - 5.1 g/dL   Globulin 2.5 1.9 - 3.7 g/dL (calc)   AG Ratio 1.6 1.0 - 2.5 (calc)   Total Bilirubin 0.3 0.2 - 1.2 mg/dL   Alkaline phosphatase (APISO) 82 37 - 153 U/L   AST 17 10 - 35 U/L   ALT 15 6 - 29 U/L  CBC with Differential/Platelet  Result Value Ref Range   WBC 5.4 3.8 - 10.8 Thousand/uL   RBC 4.46 3.80 - 5.10 Million/uL   Hemoglobin 13.2 11.7 - 15.5 g/dL   HCT 69.6 29.5 - 28.4 %   MCV 90.6 80.0 - 100.0 fL   MCH 29.6 27.0 - 33.0 pg   MCHC 32.7 32.0 - 36.0 g/dL   RDW 13.2 44.0 - 10.2 %   Platelets 199 140 - 400 Thousand/uL   MPV 12.1 7.5 - 12.5 fL   Neutro Abs 3,467 1,500 - 7,800 cells/uL   Lymphs Abs 1,453 850 - 3,900 cells/uL   Absolute Monocytes 351 200 - 950 cells/uL   Eosinophils Absolute 81 15 - 500 cells/uL   Basophils Absolute 49 0 - 200 cells/uL   Neutrophils Relative % 64.2 %   Total Lymphocyte 26.9 %   Monocytes Relative 6.5 %   Eosinophils Relative 1.5 %   Basophils Relative 0.9 %      Assessment & Plan:   Problem List Items Addressed This Visit   None    Follow up plan: No follow-ups on file.

## 2023-04-20 ENCOUNTER — Ambulatory Visit (INDEPENDENT_AMBULATORY_CARE_PROVIDER_SITE_OTHER): Payer: BC Managed Care – PPO | Admitting: Nurse Practitioner

## 2023-04-20 ENCOUNTER — Encounter: Payer: Self-pay | Admitting: Nurse Practitioner

## 2023-04-20 ENCOUNTER — Other Ambulatory Visit: Payer: Self-pay

## 2023-04-20 VITALS — BP 124/78 | HR 98 | Temp 98.0°F | Resp 16 | Ht 65.0 in | Wt 174.0 lb

## 2023-04-20 DIAGNOSIS — E785 Hyperlipidemia, unspecified: Secondary | ICD-10-CM | POA: Diagnosis not present

## 2023-04-20 DIAGNOSIS — Z1159 Encounter for screening for other viral diseases: Secondary | ICD-10-CM

## 2023-04-20 DIAGNOSIS — F419 Anxiety disorder, unspecified: Secondary | ICD-10-CM

## 2023-04-20 DIAGNOSIS — F321 Major depressive disorder, single episode, moderate: Secondary | ICD-10-CM | POA: Diagnosis not present

## 2023-04-20 DIAGNOSIS — I10 Essential (primary) hypertension: Secondary | ICD-10-CM | POA: Diagnosis not present

## 2023-04-20 DIAGNOSIS — K219 Gastro-esophageal reflux disease without esophagitis: Secondary | ICD-10-CM | POA: Diagnosis not present

## 2023-04-20 DIAGNOSIS — Z1211 Encounter for screening for malignant neoplasm of colon: Secondary | ICD-10-CM

## 2023-04-20 DIAGNOSIS — Z131 Encounter for screening for diabetes mellitus: Secondary | ICD-10-CM

## 2023-04-20 LAB — CBC WITH DIFFERENTIAL/PLATELET
Lymphs Abs: 1274 cells/uL (ref 850–3900)
MCH: 28.6 pg (ref 27.0–33.0)
MCHC: 32.8 g/dL (ref 32.0–36.0)
MPV: 12 fL (ref 7.5–12.5)
Neutro Abs: 3406 cells/uL (ref 1500–7800)
RBC: 3.85 10*6/uL (ref 3.80–5.10)
WBC: 5.2 10*3/uL (ref 3.8–10.8)

## 2023-04-20 MED ORDER — ATORVASTATIN CALCIUM 40 MG PO TABS: 40.0000 mg | ORAL_TABLET | Freq: Every day | ORAL | 3 refills | Status: DC

## 2023-04-20 MED ORDER — OMEPRAZOLE 40 MG PO CPDR: 40.0000 mg | DELAYED_RELEASE_CAPSULE | Freq: Every day | ORAL | 3 refills | Status: DC

## 2023-04-20 MED ORDER — ESCITALOPRAM OXALATE 20 MG PO TABS: 20.0000 mg | ORAL_TABLET | Freq: Every day | ORAL | 3 refills | Status: DC

## 2023-04-20 MED ORDER — BUSPIRONE HCL 7.5 MG PO TABS: 7.5000 mg | ORAL_TABLET | Freq: Three times a day (TID) | ORAL | 2 refills | Status: DC

## 2023-04-20 MED ORDER — CHLORTHALIDONE 25 MG PO TABS: 12.5000 mg | ORAL_TABLET | Freq: Every day | ORAL | 3 refills | Status: DC

## 2023-04-20 NOTE — Assessment & Plan Note (Signed)
Continue lexapro 20 mg daily, buspar 7.5 mg three times a day

## 2023-04-20 NOTE — Assessment & Plan Note (Signed)
Continue lexapro 20 mg daily, buspar 7.5 mg three times a day 

## 2023-04-20 NOTE — Assessment & Plan Note (Signed)
she is currently taking chlorthalidone 12.5 mg daily. Blood pressure at goal

## 2023-04-20 NOTE — Assessment & Plan Note (Signed)
Avoid triggers like alcohol.  Take omeprazole 40 mg daily

## 2023-04-20 NOTE — Assessment & Plan Note (Signed)
Continue taking atorvastatin 40 mg daily. 

## 2023-04-21 LAB — COMPLETE METABOLIC PANEL WITH GFR
AST: 17 U/L (ref 10–35)
Albumin: 3.9 g/dL (ref 3.6–5.1)
Alkaline phosphatase (APISO): 75 U/L (ref 37–153)
Creat: 0.92 mg/dL (ref 0.50–1.03)
Globulin: 2.5 g/dL (calc) (ref 1.9–3.7)
Glucose, Bld: 86 mg/dL (ref 65–99)
Potassium: 3.6 mmol/L (ref 3.5–5.3)
Total Protein: 6.4 g/dL (ref 6.1–8.1)

## 2023-04-21 LAB — CBC WITH DIFFERENTIAL/PLATELET
Absolute Monocytes: 359 cells/uL (ref 200–950)
Eosinophils Absolute: 109 cells/uL (ref 15–500)
HCT: 33.5 % — ABNORMAL LOW (ref 35.0–45.0)
MCV: 87 fL (ref 80.0–100.0)
Neutrophils Relative %: 65.5 %
Platelets: 191 10*3/uL (ref 140–400)
RDW: 12.7 % (ref 11.0–15.0)

## 2023-04-21 LAB — LIPID PANEL
HDL: 74 mg/dL (ref >=50)
Triglycerides: 130 mg/dL (ref <150)

## 2023-04-22 LAB — HEPATITIS C ANTIBODY: Hepatitis C Ab: NONREACTIVE

## 2023-04-22 LAB — CBC WITH DIFFERENTIAL/PLATELET
Basophils Absolute: 52 cells/uL (ref 0–200)
Basophils Relative: 1 %
Eosinophils Relative: 2.1 %
Hemoglobin: 11 g/dL — ABNORMAL LOW (ref 11.7–15.5)
Monocytes Relative: 6.9 %
Total Lymphocyte: 24.5 %

## 2023-04-22 LAB — HEMOGLOBIN A1C
Hgb A1c MFr Bld: 5.7 % of total Hgb — ABNORMAL HIGH (ref <5.7)
Mean Plasma Glucose: 117 mg/dL
eAG (mmol/L): 6.5 mmol/L

## 2023-04-22 LAB — COMPLETE METABOLIC PANEL WITH GFR
AG Ratio: 1.6 (calc) (ref 1.0–2.5)
ALT: 17 U/L (ref 6–29)
BUN: 12 mg/dL (ref 7–25)
CO2: 27 mmol/L (ref 20–32)
Calcium: 8.9 mg/dL (ref 8.6–10.4)
Chloride: 101 mmol/L (ref 98–110)
Sodium: 138 mmol/L (ref 135–146)
Total Bilirubin: 0.4 mg/dL (ref 0.2–1.2)
eGFR: 74 mL/min/{1.73_m2} (ref >=60)

## 2023-04-22 LAB — LIPID PANEL
Cholesterol: 173 mg/dL (ref <200)
LDL Cholesterol (Calc): 78 mg/dL (calc)
Non-HDL Cholesterol (Calc): 99 mg/dL (calc) (ref <130)
Total CHOL/HDL Ratio: 2.3 (calc) (ref <5.0)

## 2023-08-08 NOTE — Progress Notes (Unsigned)
Name: Cassidy Collins   MRN: [id number]    DOB: [birth year]   Date:08/09/2023       Progress Note  Subjective  Chief Complaint  Chief Complaint  Patient presents with   URI    Stuffy nose, congestion, headache, cough for 2 week. Covid test negative    I connected with  [name]  on 08/09/23 at 08:42 by a video enabled telemedicine application and verified that I am speaking with the correct person using two identifiers.  I discussed the limitations of evaluation and management by telemedicine and the availability of in person appointments. The patient expressed understanding and agreed to proceed with a virtual visit  Staff also discussed with the patient that there may be a patient responsible charge related to this service. Patient Location: home Provider Location: [facility] Additional Individuals present: alone  HPI  Sinusitis Compliant: symptoms started two weeks ago, took home covid test and was negative.  -Fever: no -Cough: yes -Shortness of breath: no -Wheezing: no -Chest congestion: yes -Nasal congestion: yes -Runny nose: yes -Post nasal drip: yes -Sore throat: no -Sinus pressure: yes -Headache: yes -Face pain: yes -Ear pain:  no -Ear pressure: yes -Relief with OTC cold/cough medications: yes, taking astelin,mucinex, advil cold and sinus and allegra.  Patient reports that she feels like she has a sinus infection.  Recommend continue taking above medications. Push fluids and get rest.   Will send in Augmentin and tessalon perls.  Diflucan for yeast    Patient Active Problem List   Diagnosis Date Noted   Early menopause 10/27/2019   Current moderate episode of major depressive disorder without prior episode (HCC) 01/17/2019   Anxiety 01/17/2019   Essential hypertension 01/17/2019   Family history of colon cancer    Family history of ovarian cancer    Hyperlipidemia 09/01/2017   Overweight (BMI 25.0-29.9) 09/01/2017   Other allergic rhinitis  09/01/2017   Gastroesophageal reflux disease without esophagitis 09/01/2017    Social History   Tobacco Use   Smoking status: Never   Smokeless tobacco: Never  Substance Use Topics   Alcohol use: Yes    Alcohol/week: 0.0 standard drinks of alcohol    Comment: occasionally on weekend     Current Outpatient Medications:    atorvastatin (LIPITOR) 40 MG tablet, Take 1 tablet (40 mg total) by mouth daily., Disp: 90 tablet, Rfl: 3   azelastine (ASTELIN) 0.1 % nasal spray, Place 2 sprays into both nostrils 2 (two) times daily. Use in each nostril as directed, Disp: 30 mL, Rfl: 12   busPIRone (BUSPAR) 7.5 MG tablet, Take 1 tablet (7.5 mg total) by mouth 3 (three) times daily., Disp: 90 tablet, Rfl: 2   chlorthalidone (HYGROTON) 25 MG tablet, Take 0.5 tablets (12.5 mg total) by mouth daily., Disp: 45 tablet, Rfl: 3   escitalopram (LEXAPRO) 20 MG tablet, Take 1 tablet (20 mg total) by mouth daily., Disp: 90 tablet, Rfl: 3   fexofenadine (ALLEGRA) 180 MG tablet, TAKE ONE TABLET BY MOUTH DAILY, Disp: 90 tablet, Rfl: 1   omeprazole (PRILOSEC) 40 MG capsule, Take 1 capsule (40 mg total) by mouth daily., Disp: 90 capsule, Rfl: 3   medroxyPROGESTERone (PROVERA) 10 MG tablet, Take 1 tablet (10 mg total) by mouth daily for 7 days. Every 3 months if no period, Disp: 28 tablet, Rfl: 0  Allergies  Allergen Reactions   Bee Venom     I personally reviewed active problem list, medication list, allergies with the  patient/caregiver today.  ROS  Ten systems reviewed and is negative except as mentioned in HPI   Objective  Virtual encounter, vitals not obtained.  Body mass index is 31.57 kg/m.  Nursing Note and Vital Signs reviewed.  Physical Exam  Awake, alert and oriented, speaking in complete sentences.   No results found for this or any previous visit (from the past 72 hour(s)).  Assessment & Plan  1. Acute recurrent pansinusitis Recommend continue taking  taking astelin,mucinex, advil  cold and sinus and allegra. . Push fluids and get rest.   Will send in Augmentin and tessalon perls.  Diflucan for yeast   - amoxicillin-clavulanate (AUGMENTIN) 875-125 MG tablet; Take 1 tablet by mouth 2 (two) times daily.  Dispense: 20 tablet; Refill: 0  2. Antibiotic-induced yeast infection Recommend continue taking  taking astelin,mucinex, advil cold and sinus and allegra. . Push fluids and get rest.   Will send in Augmentin and tessalon perls.  Diflucan for yeast   - fluconazole (DIFLUCAN) 150 MG tablet; Take 1 tablet (150 mg total) by mouth every 3 (three) days as needed (for vaginal itching/yeast infection sx).  Dispense: 2 tablet; Refill: 0  3. Acute cough Recommend continue taking  taking astelin,mucinex, advil cold and sinus and allegra. . Push fluids and get rest.   Will send in Augmentin and tessalon perls.  Diflucan for yeast   - benzonatate (TESSALON) 100 MG capsule; Take 2 capsules (200 mg total) by mouth 2 (two) times daily as needed for cough.  Dispense: 20 capsule; Refill: 0   -Red flags and when to present for emergency care or RTC including fever >101.73F, chest pain, shortness of breath, new/worsening/un-resolving symptoms,  reviewed with patient at time of visit. Follow up and care instructions discussed and provided in AVS. - I discussed the assessment and treatment plan with the patient. The patient was provided an opportunity to ask questions and all were answered. The patient agreed with the plan and demonstrated an understanding of the instructions.  I provided 15 minutes of non-face-to-face time during this encounter.  [name], FNP

## 2023-08-09 ENCOUNTER — Telehealth (INDEPENDENT_AMBULATORY_CARE_PROVIDER_SITE_OTHER): Payer: BC Managed Care – PPO | Admitting: Nurse Practitioner

## 2023-08-09 ENCOUNTER — Other Ambulatory Visit: Payer: Self-pay

## 2023-08-09 ENCOUNTER — Encounter: Payer: Self-pay | Admitting: Nurse Practitioner

## 2023-08-09 VITALS — Ht 62.25 in | Wt 174.0 lb

## 2023-08-09 DIAGNOSIS — R051 Acute cough: Secondary | ICD-10-CM | POA: Diagnosis not present

## 2023-08-09 DIAGNOSIS — J0141 Acute recurrent pansinusitis: Secondary | ICD-10-CM | POA: Diagnosis not present

## 2023-08-09 DIAGNOSIS — T3695XA Adverse effect of unspecified systemic antibiotic, initial encounter: Secondary | ICD-10-CM | POA: Diagnosis not present

## 2023-08-09 DIAGNOSIS — B379 Candidiasis, unspecified: Secondary | ICD-10-CM

## 2023-08-09 MED ORDER — AMOXICILLIN-POT CLAVULANATE 875-125 MG PO TABS
1.0000 | ORAL_TABLET | Freq: Two times a day (BID) | ORAL | 0 refills | Status: DC
Start: 2023-08-09 — End: 2023-10-23

## 2023-08-09 MED ORDER — BENZONATATE 100 MG PO CAPS
200.0000 mg | ORAL_CAPSULE | Freq: Two times a day (BID) | ORAL | 0 refills | Status: DC | PRN
Start: 2023-08-09 — End: 2023-10-23

## 2023-08-09 MED ORDER — FLUCONAZOLE 150 MG PO TABS
150.0000 mg | ORAL_TABLET | ORAL | 0 refills | Status: AC | PRN
Start: 2023-08-09 — End: ?

## 2023-08-14 ENCOUNTER — Ambulatory Visit: Payer: BC Managed Care – PPO

## 2023-08-14 DIAGNOSIS — K642 Third degree hemorrhoids: Secondary | ICD-10-CM | POA: Diagnosis not present

## 2023-08-14 DIAGNOSIS — Z1211 Encounter for screening for malignant neoplasm of colon: Secondary | ICD-10-CM | POA: Diagnosis present

## 2023-08-14 DIAGNOSIS — Z800 Family history of malignant neoplasm of digestive organs: Secondary | ICD-10-CM | POA: Diagnosis not present

## 2023-09-26 ENCOUNTER — Ambulatory Visit (INDEPENDENT_AMBULATORY_CARE_PROVIDER_SITE_OTHER): Payer: BC Managed Care – PPO

## 2023-09-26 DIAGNOSIS — Z23 Encounter for immunization: Secondary | ICD-10-CM

## 2023-10-23 ENCOUNTER — Ambulatory Visit: Payer: BC Managed Care – PPO | Admitting: Nurse Practitioner

## 2023-10-23 ENCOUNTER — Encounter: Payer: Self-pay | Admitting: Nurse Practitioner

## 2023-10-23 ENCOUNTER — Other Ambulatory Visit: Payer: Self-pay

## 2023-10-23 VITALS — BP 124/84 | HR 75 | Temp 98.1°F | Resp 16 | Ht 65.0 in | Wt 167.9 lb

## 2023-10-23 DIAGNOSIS — K219 Gastro-esophageal reflux disease without esophagitis: Secondary | ICD-10-CM | POA: Diagnosis not present

## 2023-10-23 DIAGNOSIS — E785 Hyperlipidemia, unspecified: Secondary | ICD-10-CM

## 2023-10-23 DIAGNOSIS — F321 Major depressive disorder, single episode, moderate: Secondary | ICD-10-CM

## 2023-10-23 DIAGNOSIS — F419 Anxiety disorder, unspecified: Secondary | ICD-10-CM | POA: Diagnosis not present

## 2023-10-23 DIAGNOSIS — I10 Essential (primary) hypertension: Secondary | ICD-10-CM | POA: Diagnosis not present

## 2023-10-23 DIAGNOSIS — E782 Mixed hyperlipidemia: Secondary | ICD-10-CM | POA: Diagnosis not present

## 2023-10-23 DIAGNOSIS — R7303 Prediabetes: Secondary | ICD-10-CM | POA: Insufficient documentation

## 2023-10-23 DIAGNOSIS — E663 Overweight: Secondary | ICD-10-CM

## 2023-10-23 MED ORDER — CHLORTHALIDONE 25 MG PO TABS
12.5000 mg | ORAL_TABLET | Freq: Every day | ORAL | 3 refills | Status: AC
Start: 2023-10-23 — End: ?

## 2023-10-23 MED ORDER — OMEPRAZOLE 40 MG PO CPDR
40.0000 mg | DELAYED_RELEASE_CAPSULE | Freq: Every day | ORAL | 3 refills | Status: AC
Start: 2023-10-23 — End: ?

## 2023-10-23 MED ORDER — ESCITALOPRAM OXALATE 20 MG PO TABS
20.0000 mg | ORAL_TABLET | Freq: Every day | ORAL | 3 refills | Status: AC
Start: 2023-10-23 — End: ?

## 2023-10-23 MED ORDER — ATORVASTATIN CALCIUM 40 MG PO TABS
40.0000 mg | ORAL_TABLET | Freq: Every day | ORAL | 3 refills | Status: AC
Start: 2023-10-23 — End: ?

## 2023-10-23 MED ORDER — BUPROPION HCL ER (XL) 150 MG PO TB24
150.0000 mg | ORAL_TABLET | Freq: Every day | ORAL | 0 refills | Status: DC
Start: 2023-10-23 — End: 2023-12-07

## 2023-10-23 MED ORDER — BUSPIRONE HCL 7.5 MG PO TABS
7.5000 mg | ORAL_TABLET | Freq: Three times a day (TID) | ORAL | 2 refills | Status: AC
Start: 2023-10-23 — End: ?

## 2023-10-23 NOTE — Progress Notes (Signed)
BP 124/84   Pulse 75   Temp 98.1 F (36.7 C) (Oral)   Resp 16   Ht 5\' 5"  (1.651 m)   Wt 167 lb 14.4 oz (76.2 kg)   SpO2 98%   BMI 27.94 kg/m    Subjective:    Patient ID: Cassidy Collins, female    DOB: 1966/03/07, 57 y.o.   MRN: [id number]  HPI: Cassidy Collins is a 57 y.o. female  Chief Complaint  Patient presents with   Medical Management of Chronic Issues   Discussed the use of AI scribe software for clinical note transcription with the patient, who gave verbal consent to proceed.  History of Present Illness   The patient, with a history of depression and anxiety, presents with high stress levels due to multiple personal issues. She is dealing with her mother's end-stage COPD, which is emotionally taxing as she is witnessing her decline. She is also experiencing work-related stress due to issues with FMLA paperwork and a demanding job. Additionally, she is dealing with family stressors, including a son who is struggling with personal issues and has expressed suicidal ideation.  The patient reports that her mood has been significantly affected by these stressors, and she feels like she is in "slow motion." She has been taking Lexapro 20mg  and Buspar, but not consistently. She also reports weight loss, which she attributes to stress and a part-time job that keeps her active.  The patient has no complaints of chest pain, shortness of breath, headaches, or blurred vision. She reports that her acid reflux is under control with omeprazole, and she is taking chlorthalidone 12.5mg  daily for blood pressure, which is well-controlled. She is also on atorvastatin 40mg  daily for cholesterol management.      10/23/2023    7:58 AM 08/09/2023    7:47 AM 04/20/2023    8:12 AM  Vitals with BMI  Height 5\' 5"  5' 2.25" 5\' 5"   Weight 167 lbs 14 oz 174 lbs 174 lbs  BMI 27.94 31.58 28.96  Systolic 124  124  Diastolic 84  78  Pulse 75  98    The 10-year ASCVD risk score (Arnett DK,  et al., 2019) is: 3.2%   Values used to calculate the score:     Age: 74 years     Sex: Female     Is Non-Hispanic African American: Yes     Diabetic: No     Tobacco smoker: No     Systolic Blood Pressure: 124 mmHg     Is BP treated: Yes     HDL Cholesterol: 74 mg/dL     Total Cholesterol: 173 mg/dL        40/98/1191    4:78 AM 08/09/2023    8:13 AM 04/20/2023    8:11 AM 01/02/2023    9:53 AM 05/19/2022    8:03 AM  Depression screen PHQ 2/9  Decreased Interest 2 0 0 0 1  Down, Depressed, Hopeless 2 0 1 0 1  PHQ - 2 Score 4 0 1 0 2  Altered sleeping 2  1 0 1  Tired, decreased energy 2  0 0 1  Change in appetite 2  0 0 1  Feeling bad or failure about yourself  2  0 0 1  Trouble concentrating 1  1 0 1  Moving slowly or fidgety/restless 2  0 0 0  Suicidal thoughts 0  0 0 0  PHQ-9 Score 15  3 0 7  Difficult  doing work/chores Somewhat difficult  Not difficult at all Not difficult at all Somewhat difficult       10/23/2023    7:58 AM 04/20/2023    8:11 AM 05/19/2022    8:05 AM 12/29/2021   11:16 AM  GAD 7 : Generalized Anxiety Score  Nervous, Anxious, on Edge 2 0 1 0  Control/stop worrying 2 1 1  0  Worry too much - different things 2 1 1  0  Trouble relaxing 2 0 1 0  Restless 2 0 0 0  Easily annoyed or irritable 2 1 1  0  Afraid - awful might happen 2 0 1 0  Total GAD 7 Score 14 3 6  0  Anxiety Difficulty Somewhat difficult Not difficult at all Not difficult at all Not difficult at all     Relevant past medical, surgical, family and social history reviewed and updated as indicated. Interim medical history since our last visit reviewed. Allergies and medications reviewed and updated.  Review of Systems  Constitutional: Negative for fever or weight change.  Respiratory: Negative for cough and shortness of breath.   Cardiovascular: Negative for chest pain or palpitations.  Gastrointestinal: Negative for abdominal pain, no bowel changes.  Musculoskeletal: Negative for gait  problem or joint swelling.  Skin: Negative for rash.  Neurological: Negative for dizziness or headache.  No other specific complaints in a complete review of systems (except as listed in HPI above).      Objective:    BP 124/84   Pulse 75   Temp 98.1 F (36.7 C) (Oral)   Resp 16   Ht 5\' 5"  (1.651 m)   Wt 167 lb 14.4 oz (76.2 kg)   SpO2 98%   BMI 27.94 kg/m   Wt Readings from Last 3 Encounters:  10/23/23 167 lb 14.4 oz (76.2 kg)  08/09/23 174 lb (78.9 kg)  04/20/23 174 lb (78.9 kg)    Physical Exam  Constitutional: Patient appears well-developed and well-nourished.  No distress.  HEENT: head atraumatic, normocephalic, pupils equal and reactive to light, neck supple, throat within normal limits Cardiovascular: Normal rate, regular rhythm and normal heart sounds.  No murmur heard. No BLE edema. Pulmonary/Chest: Effort normal and breath sounds normal. No respiratory distress. Abdominal: Soft.  There is no tenderness. Psychiatric: Patient has a normal mood and affect. behavior is normal. Judgment and thought content normal.  Results for orders placed or performed in visit on 04/20/23  CBC with Differential/Platelet  Result Value Ref Range   WBC 5.2 3.8 - 10.8 Thousand/uL   RBC 3.85 3.80 - 5.10 Million/uL   Hemoglobin 11.0 (L) 11.7 - 15.5 g/dL   HCT 60.4 (L) 54.0 - 98.1 %   MCV 87.0 80.0 - 100.0 fL   MCH 28.6 27.0 - 33.0 pg   MCHC 32.8 32.0 - 36.0 g/dL   RDW 19.1 47.8 - 29.5 %   Platelets 191 140 - 400 Thousand/uL   MPV 12.0 7.5 - 12.5 fL   Neutro Abs 3,406 1,500 - 7,800 cells/uL   Lymphs Abs 1,274 850 - 3,900 cells/uL   Absolute Monocytes 359 200 - 950 cells/uL   Eosinophils Absolute 109 15 - 500 cells/uL   Basophils Absolute 52 0 - 200 cells/uL   Neutrophils Relative % 65.5 %   Total Lymphocyte 24.5 %   Monocytes Relative 6.9 %   Eosinophils Relative 2.1 %   Basophils Relative 1.0 %  COMPLETE METABOLIC PANEL WITH GFR  Result Value Ref Range   Glucose, Bld 86  65 -  99 mg/dL   BUN 12 7 - 25 mg/dL   Creat 1.88 4.16 - 6.06 mg/dL   eGFR 74 > OR = 60 TK/ZSW/1.09N2   BUN/Creatinine Ratio SEE NOTE: 6 - 22 (calc)   Sodium 138 135 - 146 mmol/L   Potassium 3.6 3.5 - 5.3 mmol/L   Chloride 101 98 - 110 mmol/L   CO2 27 20 - 32 mmol/L   Calcium 8.9 8.6 - 10.4 mg/dL   Total Protein 6.4 6.1 - 8.1 g/dL   Albumin 3.9 3.6 - 5.1 g/dL   Globulin 2.5 1.9 - 3.7 g/dL (calc)   AG Ratio 1.6 1.0 - 2.5 (calc)   Total Bilirubin 0.4 0.2 - 1.2 mg/dL   Alkaline phosphatase (APISO) 75 37 - 153 U/L   AST 17 10 - 35 U/L   ALT 17 6 - 29 U/L  Lipid panel  Result Value Ref Range   Cholesterol 173 <200 mg/dL   HDL 74 > OR = 50 mg/dL   Triglycerides 355 <732 mg/dL   LDL Cholesterol (Calc) 78 mg/dL (calc)   Total CHOL/HDL Ratio 2.3 <5.0 (calc)   Non-HDL Cholesterol (Calc) 99 <202 mg/dL (calc)  Hemoglobin R4Y  Result Value Ref Range   Hgb A1c MFr Bld 5.7 (H) <5.7 % of total Hgb   Mean Plasma Glucose 117 mg/dL   eAG (mmol/L) 6.5 mmol/L  Hepatitis C antibody  Result Value Ref Range   Hepatitis C Ab NON-REACTIVE NON-REACTIVE      Assessment & Plan:   Problem List Items Addressed This Visit       Cardiovascular and Mediastinum   Essential hypertension - Primary   Relevant Medications   chlorthalidone (HYGROTON) 25 MG tablet   atorvastatin (LIPITOR) 40 MG tablet   Other Relevant Orders   CBC with Differential/Platelet   COMPLETE METABOLIC PANEL WITH GFR     Digestive   Gastroesophageal reflux disease without esophagitis   Relevant Medications   omeprazole (PRILOSEC) 40 MG capsule     Other   Hyperlipidemia   Relevant Medications   chlorthalidone (HYGROTON) 25 MG tablet   atorvastatin (LIPITOR) 40 MG tablet   Other Relevant Orders   Lipid panel   Overweight (BMI 25.0-29.9)   Current moderate episode of major depressive disorder without prior episode (HCC)   Relevant Medications   buPROPion (WELLBUTRIN XL) 150 MG 24 hr tablet   escitalopram (LEXAPRO) 20 MG  tablet   busPIRone (BUSPAR) 7.5 MG tablet   Anxiety   Relevant Medications   buPROPion (WELLBUTRIN XL) 150 MG 24 hr tablet   escitalopram (LEXAPRO) 20 MG tablet   busPIRone (BUSPAR) 7.5 MG tablet   Prediabetes   Relevant Orders   Hemoglobin A1c     Assessment and Plan    Depression High stress levels due to personal and family issues. Currently on Lexapro 20mg  daily and Buspar twice daily. Reports feeling in "slow motion" on Lexapro. -Add Wellbutrin for additional mood support and potential energy boost. -Continue Lexapro 20mg  daily and Buspar twice daily. -Follow-up in 4 weeks to assess response to Wellbutrin.  Hypertension Well-controlled with Chlorthalidone 12.5mg  daily. -Continue Chlorthalidone 12.5mg  daily.  Hyperlipidemia No reported issues with Atorvastatin 40mg  daily. -Continue Atorvastatin 40mg  daily.  Gastroesophageal Reflux Disease (GERD) No current symptoms, taking Omeprazole daily. -Continue Omeprazole daily.  General Health Maintenance -Inquire about Shingles vaccine at pharmacy.        Follow up plan: Return in about 4 weeks (around 11/20/2023) for follow up.

## 2023-10-24 LAB — CBC WITH DIFFERENTIAL/PLATELET
Absolute Lymphocytes: 1525 {cells}/uL (ref 850–3900)
Absolute Monocytes: 350 {cells}/uL (ref 200–950)
Basophils Absolute: 60 {cells}/uL (ref 0–200)
Basophils Relative: 1.2 %
Eosinophils Absolute: 110 {cells}/uL (ref 15–500)
Eosinophils Relative: 2.2 %
HCT: 38.3 % (ref 35.0–45.0)
Hemoglobin: 12.1 g/dL (ref 11.7–15.5)
MCH: 27.1 pg (ref 27.0–33.0)
MCHC: 31.6 g/dL — ABNORMAL LOW (ref 32.0–36.0)
MCV: 85.9 fL (ref 80.0–100.0)
MPV: 12.3 fL (ref 7.5–12.5)
Monocytes Relative: 7 %
Neutro Abs: 2955 {cells}/uL (ref 1500–7800)
Neutrophils Relative %: 59.1 %
Platelets: 230 10*3/uL (ref 140–400)
RBC: 4.46 10*6/uL (ref 3.80–5.10)
RDW: 13.8 % (ref 11.0–15.0)
Total Lymphocyte: 30.5 %
WBC: 5 10*3/uL (ref 3.8–10.8)

## 2023-10-24 LAB — COMPLETE METABOLIC PANEL WITH GFR
AG Ratio: 1.4 (calc) (ref 1.0–2.5)
ALT: 21 U/L (ref 6–29)
AST: 22 U/L (ref 10–35)
Albumin: 4.2 g/dL (ref 3.6–5.1)
Alkaline phosphatase (APISO): 88 U/L (ref 37–153)
BUN: 14 mg/dL (ref 7–25)
CO2: 28 mmol/L (ref 20–32)
Calcium: 9.5 mg/dL (ref 8.6–10.4)
Chloride: 102 mmol/L (ref 98–110)
Creat: 0.87 mg/dL (ref 0.50–1.03)
Globulin: 3 g/dL (ref 1.9–3.7)
Glucose, Bld: 101 mg/dL — ABNORMAL HIGH (ref 65–99)
Potassium: 4.1 mmol/L (ref 3.5–5.3)
Sodium: 141 mmol/L (ref 135–146)
Total Bilirubin: 0.5 mg/dL (ref 0.2–1.2)
Total Protein: 7.2 g/dL (ref 6.1–8.1)
eGFR: 78 mL/min/{1.73_m2} (ref >=60)

## 2023-10-24 LAB — LIPID PANEL
Cholesterol: 199 mg/dL (ref <200)
HDL: 78 mg/dL (ref >=50)
LDL Cholesterol (Calc): 102 mg/dL — ABNORMAL HIGH
Non-HDL Cholesterol (Calc): 121 mg/dL (ref <130)
Total CHOL/HDL Ratio: 2.6 (calc) (ref <5.0)
Triglycerides: 92 mg/dL (ref <150)

## 2023-10-24 LAB — HEMOGLOBIN A1C
Hgb A1c MFr Bld: 5.7 %{Hb} — ABNORMAL HIGH (ref <5.7)
Mean Plasma Glucose: 117 mg/dL
eAG (mmol/L): 6.5 mmol/L

## 2023-11-20 NOTE — Progress Notes (Deleted)
There were no vitals taken for this visit.   Subjective:    Patient ID: Cassidy Collins, female    DOB: 1966/02/11, 57 y.o.   MRN: [id number]  HPI: Cassidy Collins is a 57 y.o. female  No chief complaint on file.   Discussed the use of AI scribe software for clinical note transcription with the patient, who gave verbal consent to proceed.  History of Present Illness           10/23/2023    7:57 AM 08/09/2023    8:13 AM 04/20/2023    8:11 AM  Depression screen PHQ 2/9  Decreased Interest 2 0 0  Down, Depressed, Hopeless 2 0 1  PHQ - 2 Score 4 0 1  Altered sleeping 2  1  Tired, decreased energy 2  0  Change in appetite 2  0  Feeling bad or failure about yourself  2  0  Trouble concentrating 1  1  Moving slowly or fidgety/restless 2  0  Suicidal thoughts 0  0  PHQ-9 Score 15  3  Difficult doing work/chores Somewhat difficult  Not difficult at all    Relevant past medical, surgical, family and social history reviewed and updated as indicated. Interim medical history since our last visit reviewed. Allergies and medications reviewed and updated.  Review of Systems  Per HPI unless specifically indicated above     Objective:    There were no vitals taken for this visit.  {Vitals History (Optional):23777} Wt Readings from Last 3 Encounters:  10/23/23 167 lb 14.4 oz (76.2 kg)  08/09/23 174 lb (78.9 kg)  04/20/23 174 lb (78.9 kg)    Physical Exam  Results for orders placed or performed in visit on 10/23/23  CBC with Differential/Platelet   Collection Time: 10/23/23  8:17 AM  Result Value Ref Range   WBC 5.0 3.8 - 10.8 Thousand/uL   RBC 4.46 3.80 - 5.10 Million/uL   Hemoglobin 12.1 11.7 - 15.5 g/dL   HCT 08.6 57.8 - 46.9 %   MCV 85.9 80.0 - 100.0 fL   MCH 27.1 27.0 - 33.0 pg   MCHC 31.6 (L) 32.0 - 36.0 g/dL   RDW 62.9 52.8 - 41.3 %   Platelets 230 140 - 400 Thousand/uL   MPV 12.3 7.5 - 12.5 fL   Neutro Abs 2,955 1,500 - 7,800 cells/uL   Absolute  Lymphocytes 1,525 850 - 3,900 cells/uL   Absolute Monocytes 350 200 - 950 cells/uL   Eosinophils Absolute 110 15 - 500 cells/uL   Basophils Absolute 60 0 - 200 cells/uL   Neutrophils Relative % 59.1 %   Total Lymphocyte 30.5 %   Monocytes Relative 7.0 %   Eosinophils Relative 2.2 %   Basophils Relative 1.2 %  COMPLETE METABOLIC PANEL WITH GFR   Collection Time: 10/23/23  8:17 AM  Result Value Ref Range   Glucose, Bld 101 (H) 65 - 99 mg/dL   BUN 14 7 - 25 mg/dL   Creat 2.44 0.10 - 2.72 mg/dL   eGFR 78 > OR = 60 ZD/GUY/4.03K7   BUN/Creatinine Ratio SEE NOTE: 6 - 22 (calc)   Sodium 141 135 - 146 mmol/L   Potassium 4.1 3.5 - 5.3 mmol/L   Chloride 102 98 - 110 mmol/L   CO2 28 20 - 32 mmol/L   Calcium 9.5 8.6 - 10.4 mg/dL   Total Protein 7.2 6.1 - 8.1 g/dL   Albumin 4.2 3.6 - 5.1 g/dL  Globulin 3.0 1.9 - 3.7 g/dL (calc)   AG Ratio 1.4 1.0 - 2.5 (calc)   Total Bilirubin 0.5 0.2 - 1.2 mg/dL   Alkaline phosphatase (APISO) 88 37 - 153 U/L   AST 22 10 - 35 U/L   ALT 21 6 - 29 U/L  Lipid panel   Collection Time: 10/23/23  8:17 AM  Result Value Ref Range   Cholesterol 199 <200 mg/dL   HDL 78 > OR = 50 mg/dL   Triglycerides 92 <474 mg/dL   LDL Cholesterol (Calc) 102 (H) mg/dL (calc)   Total CHOL/HDL Ratio 2.6 <5.0 (calc)   Non-HDL Cholesterol (Calc) 121 <130 mg/dL (calc)  Hemoglobin Q5Z   Collection Time: 10/23/23  8:17 AM  Result Value Ref Range   Hgb A1c MFr Bld 5.7 (H) <5.7 % of total Hgb   Mean Plasma Glucose 117 mg/dL   eAG (mmol/L) 6.5 mmol/L   {Labs (Optional):23779}    Assessment & Plan:   Problem List Items Addressed This Visit   None    Assessment and Plan             Follow up plan: No follow-ups on file.

## 2023-11-23 ENCOUNTER — Ambulatory Visit: Payer: BC Managed Care – PPO | Admitting: Nurse Practitioner

## 2023-12-05 ENCOUNTER — Other Ambulatory Visit: Payer: Self-pay | Admitting: Nurse Practitioner

## 2023-12-05 DIAGNOSIS — F321 Major depressive disorder, single episode, moderate: Secondary | ICD-10-CM
